# Patient Record
Sex: Female | Born: 1970 | Race: White | Hispanic: No | Smoking: Never smoker
Health system: Southern US, Community
[De-identification: ages and names within clinical notes are randomized; demographics above are authoritative.]

## PROBLEM LIST (undated history)

## (undated) DIAGNOSIS — Z8619 Personal history of other infectious and parasitic diseases: Secondary | ICD-10-CM

## (undated) DIAGNOSIS — L509 Urticaria, unspecified: Secondary | ICD-10-CM

## (undated) DIAGNOSIS — H209 Unspecified iridocyclitis: Secondary | ICD-10-CM

## (undated) HISTORY — DX: Urticaria, unspecified: L50.9

## (undated) HISTORY — DX: Unspecified iridocyclitis: H20.9

## (undated) HISTORY — DX: Personal history of other infectious and parasitic diseases: Z86.19

---

## 2002-01-04 HISTORY — PX: COMBINED AUGMENTATION MAMMAPLASTY AND ABDOMINOPLASTY: SUR291

## 2005-09-14 ENCOUNTER — Encounter: Admission: RE | Admit: 2005-09-14 | Discharge: 2005-09-14 | Payer: Self-pay | Admitting: *Deleted

## 2006-12-09 ENCOUNTER — Inpatient Hospital Stay (HOSPITAL_COMMUNITY): Admission: AD | Admit: 2006-12-09 | Discharge: 2006-12-09 | Payer: Self-pay | Admitting: Obstetrics & Gynecology

## 2007-02-20 ENCOUNTER — Inpatient Hospital Stay (HOSPITAL_COMMUNITY): Admission: AD | Admit: 2007-02-20 | Discharge: 2007-03-06 | Payer: Self-pay | Admitting: Obstetrics and Gynecology

## 2007-02-20 ENCOUNTER — Ambulatory Visit: Payer: Self-pay | Admitting: Internal Medicine

## 2007-02-20 ENCOUNTER — Ambulatory Visit: Payer: Self-pay | Admitting: Cardiology

## 2007-02-23 ENCOUNTER — Ambulatory Visit: Payer: Self-pay | Admitting: Vascular Surgery

## 2007-02-23 ENCOUNTER — Encounter (INDEPENDENT_AMBULATORY_CARE_PROVIDER_SITE_OTHER): Payer: Self-pay | Admitting: Obstetrics and Gynecology

## 2007-02-23 ENCOUNTER — Encounter: Payer: Self-pay | Admitting: Internal Medicine

## 2007-03-03 ENCOUNTER — Encounter (INDEPENDENT_AMBULATORY_CARE_PROVIDER_SITE_OTHER): Payer: Self-pay | Admitting: Obstetrics

## 2007-03-03 ENCOUNTER — Ambulatory Visit: Payer: Self-pay | Admitting: Family Medicine

## 2007-11-03 ENCOUNTER — Ambulatory Visit: Payer: Self-pay | Admitting: *Deleted

## 2007-11-16 ENCOUNTER — Ambulatory Visit: Payer: Self-pay | Admitting: *Deleted

## 2007-11-16 DIAGNOSIS — J069 Acute upper respiratory infection, unspecified: Secondary | ICD-10-CM | POA: Insufficient documentation

## 2007-11-16 DIAGNOSIS — D649 Anemia, unspecified: Secondary | ICD-10-CM | POA: Insufficient documentation

## 2008-10-22 IMAGING — CR DG CHEST 2V
2 series · 2 of 2 positions shown · non-contrast
Comparison: none

CLINICAL DATA: Pneumonia

Chest 2 view:
Comparison to previous exam. There is central pulmonary vascular congestion.
Bibasilar interstitial and patchy airspace opacities in the right middle lobe
and both lower lobes stable since previous exam. Probable tiny pleural
effusions. Heart size upper limits normal.

[view not recorded (1 of 2)]
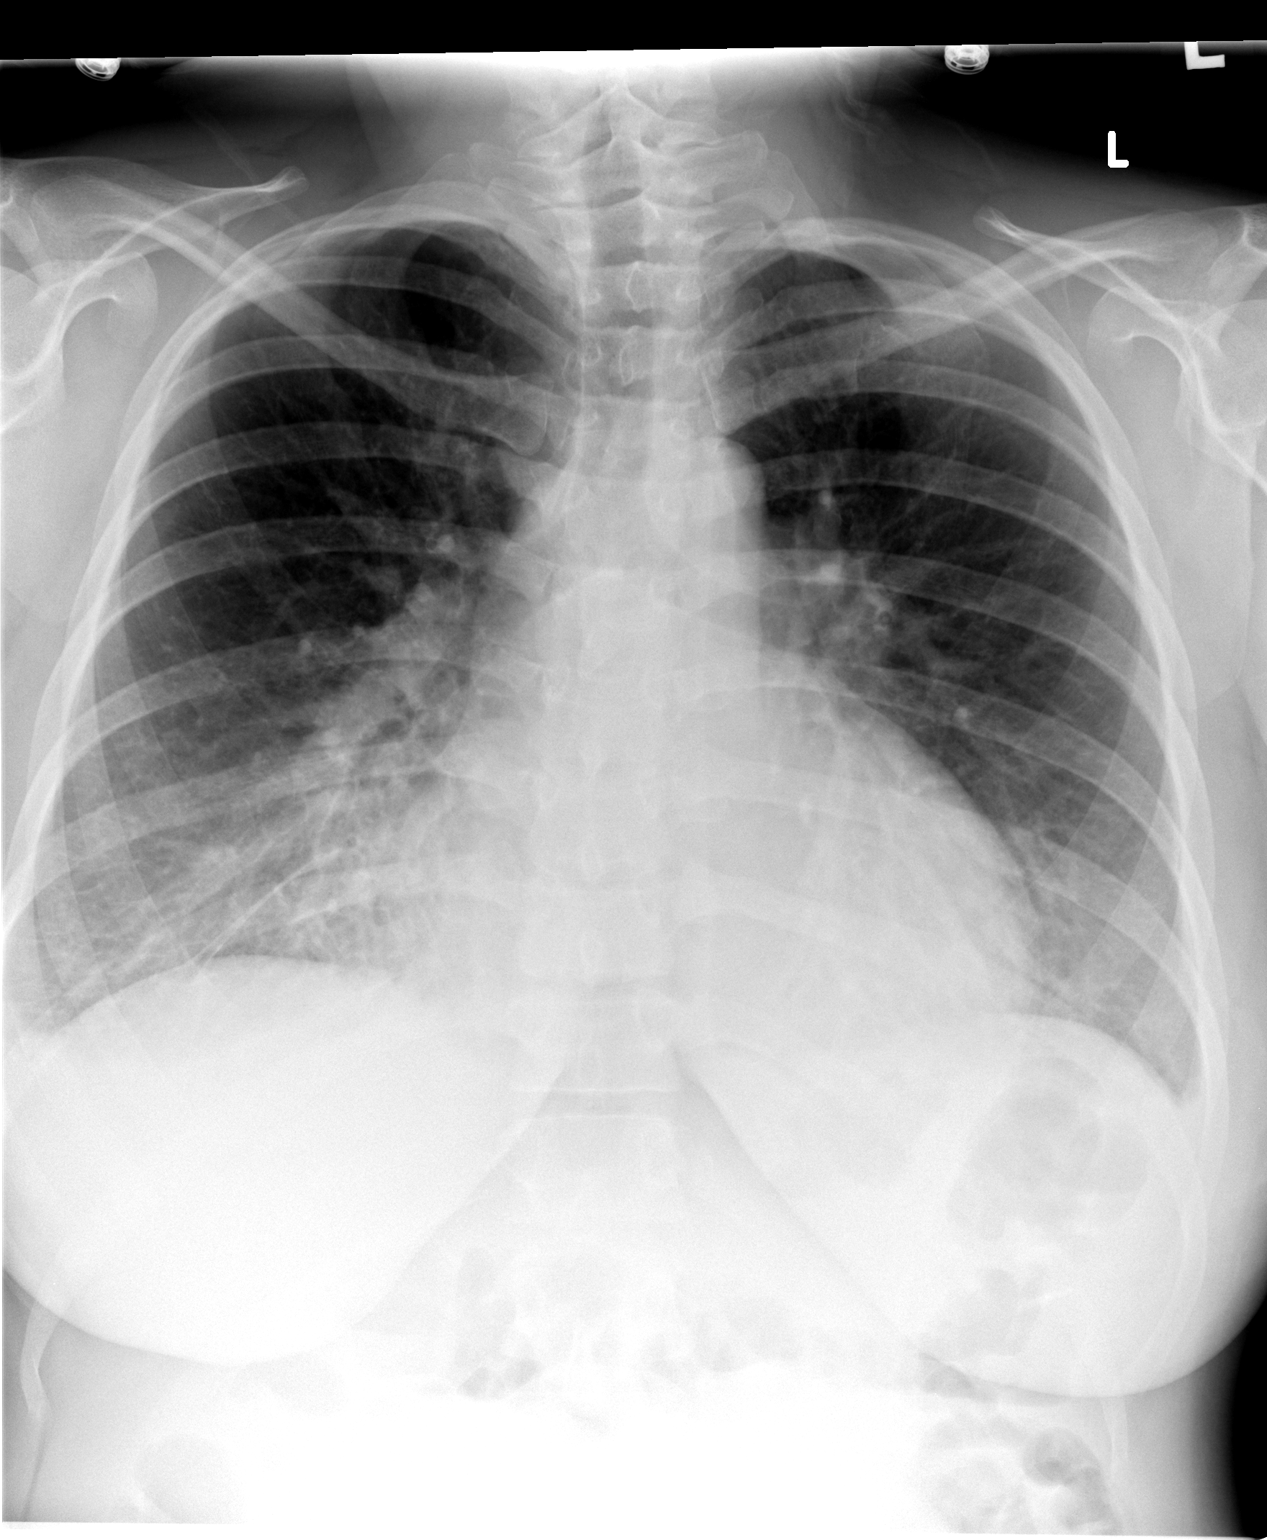

[view not recorded (2 of 2)]
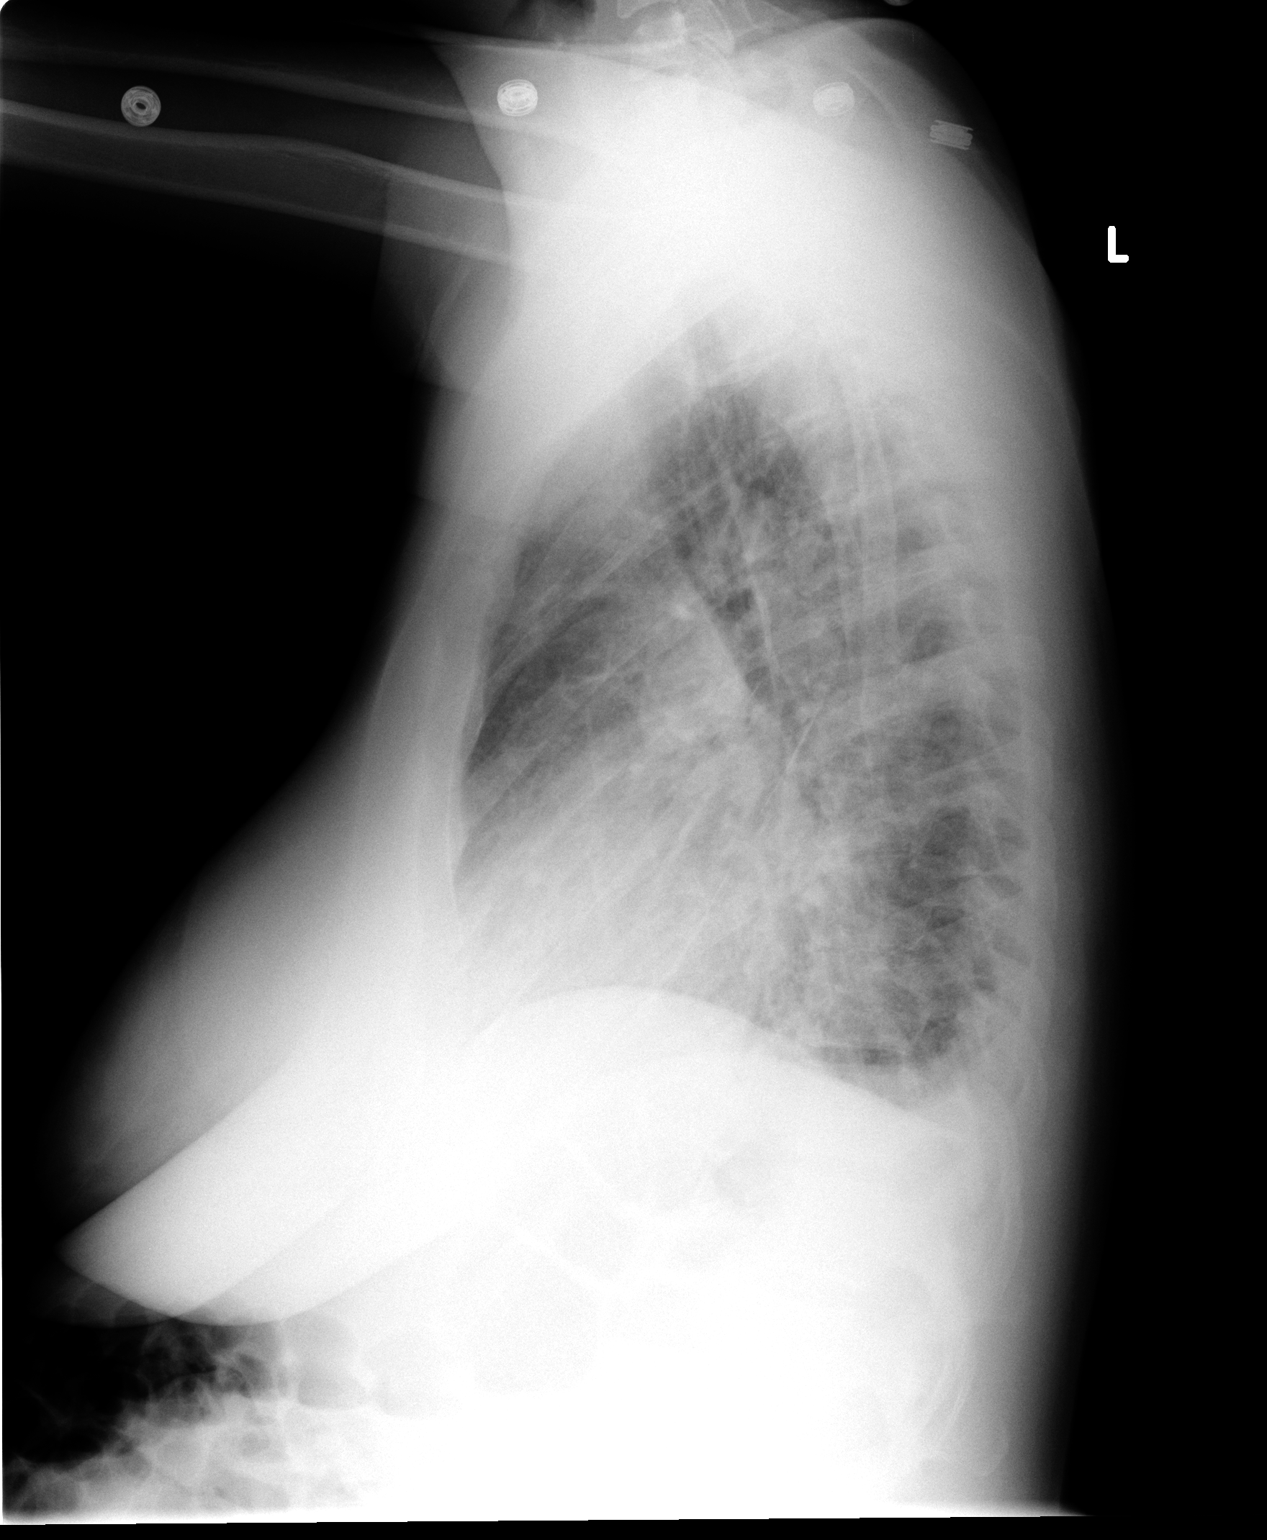

[2 of 2 positions shown; findings below may reference images not displayed]

IMPRESSION: 1. Stable bibasilar infiltrates/edema with tiny effusions.

## 2010-05-19 NOTE — Op Note (Signed)
NAMESABINE, TENENBAUM                ACCOUNT NO.:  [id number]   MEDICAL RECORD NO.:  [id number]          PATIENT TYPE:  INP   LOCATION:  9310                          FACILITY:  [facility]   PHYSICIAN:  Lendon Colonel, MD   DATE OF BIRTH:  01/10/1970   DATE OF PROCEDURE:  03/03/2007  DATE OF DISCHARGE:                               OPERATIVE REPORT   PREOPERATIVE DIAGNOSIS:  Active labor, 32 weeks, di/di twins, premature  or preterm rupture of membranes.   POSTOPERATIVE DIAGNOSIS:  Active labor, 32 weeks, di/di twins, premature  or preterm rupture of membranes.   PROCEDURE:  Primary low transverse cesarean section.   SURGEON:  Alphonsus Sias. Ernestina Penna, MD.   ASSISTANTShelbie Proctor. Shawnie Pons, MD.   ANESTHESIA:  Spinal.   SPECIMENS:  Placenta.   DISPOSITION:  Pathology.   ESTIMATED BLOOD LOSS:  800 ml.   COMPLICATIONS:  None.   FINDINGS:  Normal uterus, tubes, and ovaries, normal placenta, two  female babies.  Baby A was ruptured, Apgar's 7 and 9, cord gas 7.31,  vertex position, 3 pounds.  Baby B membranes intact, Apgar's 5 and 7,  cord pH 7.37, 3 pounds, complete breech.   INDICATIONS:  This is a 40 year old, G1, at 32 weeks 0 days by IVF  dating, who had been in-house for 10 days for premature preterm rupture  of membranes.  The patient is status post betamethasone, status post  latency antibiotics.  Two hours prior to delivery, the patient began  noticing active contractions and abdominal pain.  Her white count had  elevated to 18, and on the tocometer she was contracting every two  minutes.  Cervical exam revealed the patient to be 5 cm, and decision  was made to proceed to the operating room.  Labor ensued quite rapidly  and by the time we entered the operating room the patient was fully  dilated with the vertex of Baby A in the +3 station.   PROCEDURE:  After adequate anesthesia was obtained, the patient was  prepped and draped in the normal sterile fashion in the dorsal supine  position.  A Foley catheter was inserted into the bladder.  A  Pfannenstiel skin incision was made with the scalpel and carried through  to the underlying layer of fascia with the Bovie cautery, and the fascia  was incised in the midline, and the incision was extended laterally with  the bandage scissors.  The inferior aspect of the fascial incision was  grasped with the Kocher clamps, elevated up, and the underlying rectus  muscles dissected off sharply.  Attention turned to the superior aspect  of the fascial incision at which time the fascia was grasped with the  pickups and the underlying rectus muscles were dissected off sharply.  The rectus muscles were separated in the midline bluntly, the  pyramidalis muscles were divided with the Mayo scissors.  The peritoneum  was identified and entered bluntly, and the peritoneal incision was  extended superiorly and inferiorly with good visualization of the  bladder.  The bladder was noted to be adhered very high on  the uterus  and was taken down sharply with Metzenbaums..  The bladder blade was  inserted, the lower uterine segment was incised in a transverse fashion  with the scalpel.  Baby A was delivered in the vertex position without  complication.  The cord was clamped and cut, and the infant handed off  to the awaiting neonatologist.  The sac of Baby B was ruptured, and Baby  B was delivered in a complete breech position.  The cord was clamped and  cut, and the infant was handed off to the awaiting neonatologist.  The  cord gases were obtained, the placenta was  expressed, the uterus was  exteriorized, cleared of all clots and debris, the uterine incision was  repaired with #0 Vicryl in a running lock fashion.  On the second layer,  the same suture was used in an imbricating fashion to obtain excellent  hemostasis.  The uterus was returned to the abdomen, the gutters were  cleared of all clots and debris, the uterine incision was  reinspected  and found to be hemostatic, and the tagged edges were cut.  The  peritoneum was closed with a #3-0 Vicryl on the CT-1 in a running  fashion.  The fascia was reinspected and found to be hemostatic.  The  fascia was closed with an #0 Vicryl in a running fashion in a single  layer.  The subcutaneous tissue was irrigated, several small capillary  bleeders were Bovie cauterized for hemostasis, and the skin was closed  with staples.  The patient tolerated the procedure well, sponge, lap,  and needle counts were correct x3, and the patient was taken to the  recovery room in stable condition.      Lendon Colonel, MD  Electronically Signed     KAF/MEDQ  D:  03/03/2007  T:  03/03/2007  Job:  [id number]

## 2010-05-19 NOTE — H&P (Signed)
NAMEKRISHIKA, BUGGE                ACCOUNT NO.:  [id number]   MEDICAL RECORD NO.:  [id number]          PATIENT TYPE:  INP   LOCATION:  9160                          FACILITY:  [facility]   PHYSICIAN:  Lenoard Aden, M.D.DATE OF BIRTH:  [birth year]   DATE OF ADMISSION:  02/20/2007  DATE OF DISCHARGE:                              HISTORY & PHYSICAL   CHIEF COMPLAINT:  Spontaneous rupture of membranes.   HISTORY OF PRESENT ILLNESS:  She is a 40 year old white female, G1, P0  at 30-3/7 weeks with a known twin gestation, who presents with  spontaneous rupture of membranes.  This evening she denies contractions.  She has history of fertility related assistance to achieve pregnancy  with success as noted.  She has otherwise no documented explanation for  her infertility.   Pregnancy course has been complicated by preterm labor with, to date,  concordant growth of twins and negative fetal fibrinosis to date.   ALLERGIES:  No known drug allergies.   MEDICATIONS:  Prenatal vitamins and progesterone.   PAST SURGICAL HISTORY:  She has a history of  1. Breast augmentation.  2. Laparoscopy for endometriosis.   SOCIAL HISTORY:  She is a nonsmoker, nondrinker.  She denies domestic or  physical violence.   FAMILY HISTORY:  Remarkable for heart disease and chronic hypertension.   PHYSICAL EXAMINATION:  GENERAL APPEARANCE:  She is a well-developed,  well-nourished white female in no acute distress.  HEENT:  Normal.  LUNGS:  Clear.  CARDIOVASCULAR:  Regular rate and rhythm.  ABDOMEN:  Soft, gravid and nontender.  NEUROLOGIC:  Nonfocal.  SKIN:  Intact.  PELVIC:  Cervix 1 cm, 100%, vertex 0 stage, clear amniotic fluid noted.  NSTs are reassuring for A and B.   Ultrasound reveals a vertex presenting twin A with a subjectively  decreased amniotic fluid volume at the 59th percentile.  Twin B is  transverse with biometry pending.   IMPRESSION:  A 30-3/7 week twin intrauterine pregnancy, vertex,  transverse presenting.  PPROM of A, no evidence of active labor at this  time.   PLAN:  Given that GBS is pending, will now switch from ampicillin to  ampicillin and erythromycin followed by standard protocol.  Monitor  continuously.  Monitor signs and symptoms of choreo.  Will administer  betamethasone 12.5 mg IM, repeat in 24 hours.  If the patient labors,  she has been counseled by her primary physician, Dr. Earlene Plater, to  potentially proceed with cesarean section.      Lenoard Aden, M.D.  Electronically Signed     RJT/MEDQ  D:  02/20/2007  T:  02/20/2007  Job:  [id number]

## 2010-05-19 NOTE — Consult Note (Signed)
NAMEJACKLINE, [name]                ACCOUNT NO.:  [id number]   MEDICAL RECORD NO.:  [id number]          PATIENT TYPE:  INP   LOCATION:  9156                          [facility]ACILITY:  [facility]   PHYSICIAN:  Kalman Shan, MD   DATE OF BIRTH:  [birth year]   DATE OF CONSULTATION:  02/23/2007  DATE OF DISCHARGE:                                 CONSULTATION   TIME OF EVALUATION:  Between 1 and 1:45 p.m., February 23, 2007, 45-  minute inpatient consult.   CHIEF COMPLAINT:  Shortness of breath and chest tightness after  spontaneous rupture of membranes.   REQUESTING PHYSICIAN:  Maxie Better, M.D.   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  She  has been in bedrest since 19 weeks of gestation due to a shortened  cervix.  On Monday, February 20, 2007 in the early hours at around 2:45  a.m., she reports that she had spontaneous rupture of membranes and had  spillage of amniotic fluid.  Therefore, she presented to the [facility]   The following day, she recollects getting some IV magnesium which was  February 17, Tuesday.  That same night, she began to experience some  chest tightness.  She attributes the development of chest tightness to  the IV magnesium for the rest of the night she had chest tightness along  with orthopnea.  She could not lie at anything below 45 degrees.  The  symptoms persisted through February 18, Wednesday during which she  received 20 mg of IV Lasix x1 and since then has begun to feel  progressively better.  This morning, she reports improved chest  tightness and orthopnea.  Bedside RN states that day shift nurses have  never auscultated any crackles, but night shift nurses for the last  three nights have auscultated crackles.  Chest x-ray which was done on  February 18 and 19 shows, in my opinion, cardiomegaly, and bilateral  interstitial infiltrates (official  report is pulmonary vascular  congestion).   On admission, she was started on amoxicillin and erythromycin for  spontaneous rupture of membranes, but on the 18th of February, 2009, she  was started on Rocephin and azithromycin in case the current problems  were related to community-acquired pneumonia.  The patient is puzzled  that she could have community-acquired pneumonia because she has been at  bedrest and isolated from any sick contacts during this entire time.   She denies any abnormal edema.  There is no history of any tocolytic use  during this admission to prevent preterm labor.  Bedside RN denies any  high blood pressure or any issues such as preeclampsia.  No unilateral  swelling of the legs, no hemoptysis.   Of note, she has had some nonspecific right back pain and right scapular  pain that has been persistent since admission of February 16.  She is  not sure if this is related to her current problems.  She denies any  fever, paroxysmal nocturnal dyspnea, although she admits  to some cough  and some wheezing intermittently.   PAST MEDICAL HISTORY:  G1, P0, [redacted] weeks pregnant.  Antenatal history  complicated by a history of fertility assistance.  Pregnancy has been  complicated by preterm labor.   PAST SURGICAL HISTORY:  1. Breast augmentation.  2. Laparoscopic for endometriosis.   ALLERGIES:  NO KNOWN DRUG ALLERGIES.   HOME MEDICATIONS:  Prenatal vitamins and progesterone.   CURRENT MEDICATIONS:  Include docusate, prenatal vitamins, ferrous  sulfate, azithromycin on day 2, ceftriaxone on day 2, Tylenol p.r.n.,  antacid, erythromycin, ophthalmic ointment, pitocin if needed but is not  required, because she has not had any contractions, Ambien p.r.n.   REVIEW OF SYSTEMS:  Positive as in history of present illness, no foul-  smelling vaginal discharge, no hematuria, no abnormal contractions.  Fetal heart rate is supposedly good per bedside RN.   SOCIAL HISTORY:   Nonsmoker, nondrinker.  Denies domestic violence.  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   PHYSICAL EXAMINATION:  VITAL SIGNS:  Heart rate 88, pulse ox 96%,  respiratory rate 20, blood pressure 100/53 with a  of 68.  Review of the  vital signs for the entire list showed that they are pretty stable.  She  has been afebrile.  GENERAL:  Alert and oriented x3, speech normal, ambulation normal, very  pleasant and cooperative.  PSYCHIATRIC:  Pleasant affect, cooperative.  HEENT:  Elevated JVP 7 to 8 cm above the angle of Louis at a 45-degree  angle.  No neck nodes.  Hepatojugular reflux present.  CARDIOVASCULAR:  Normal heart sounds, normal rate, no murmurs.  RESPIRATORY:  Central air entry equal, faint crackles heard basally but  I am unsure.  Respiratory exam could equally be clear.  ABDOMEN:  Soft, obese because of the pregnancy, hepatojugular reflux  present, normal bowel sounds.  EXTREMITIES:  Mild bilateral pedal edema consistent with pregnancy.  SKIN:  Intact.  MUSCULOSKELETAL:  No joint deformities.   LABORATORY DATA:  Chest x-ray:  Official report on February 18  is  concern for right middle lobe pneumonia superimposed on venous  congestion and mild interstitial edema as read by Dr. Genevive Bi.  I personally feel there is bilateral interstitial infiltrates with  possible cardiomegaly.  In fact, this is more pronounced on the 03/12/22 chest x-ray, however, the cardiomegaly seems more prominent in  current chest x-ray.  However, this could be artifactual cardiomegaly  because it is a portable view.   LAB WORK:  Hematology admission on the 16th of February shows a white  count of 15, hemoglobin 10.7, platelets 235.  The steroids were  instituted, and the white count went up to 22.6 on February 18 after  starting azithromycin and ceftriaxone. White count is now 17,000,  platelet count is 210,000.  The white count shows 82% polymorphs.   ASSESSMENT/PLAN:  This is a 40 year old  G1, P0 with spontaneous rupture  of membranes at 31 weeks of pregnancy.  No spontaneous labor.  Differential diagnosis includes:   1. Preeclampsia.  This is unlikely because the blood pressure is      normal.  2. Amniotic fluid embolism.  This is not possible because she is      currently well.  Most of these patients develop acute illness and      die quickly.  3. Community-acquired pneumonia.  The x-ray does not fit in with      community-acquired pneumonia, and there is no fever, and there is  no sputum production.  However, it would be prudent to rule it out      by getting urine Legionella and Streptococcal antigen.  If      positive, it will be confirmatory.  If it is negative, will have to      use clinical pretest probability.  I agree with continuing      azithromycin and ceftriaxone for now.  4. Aspiration pneumonia.  She has been in the bed.  It is possible she      aspirated some sinus secretions, but there is no clear-cut      vomiting.  The x-ray does not fit in again with aspiration      pneumonia.  5. Pulmonary embolism.  Again, the history and the x-ray is      inconsistent with that.  6. Cardiac failure.  This is actually possible because the x-ray shows      possible cardiomegaly, bilateral interstitial infiltrates, and a      slight jugular venous pulse, and crackles were heard by the nurses.      For this, I would obtain a 2-D echo and beta natriuretic peptide      immediately.  The fact that she also responded to Lasix suggests      that there might be some underlying cardiac issues.  7. Tocolytic Pulmonary Edema - At first I thought this is unlikely due      to lack of beta agonist administration. However, after discussing      with Dr. [facility] this is probably the most likely diagnosis      due to the fact she got magnesium, and is twin pregnancy.   PLAN:  To get 2-D echo and labs, and I will follow.   Thank you for this interesting  consult.      Kalman Shan, MD  Electronically Signed     MR/MEDQ  D:  02/23/2007  T:  02/23/2007  Job:  [id number]

## 2010-05-22 NOTE — Discharge Summary (Signed)
NAMELATASHIA, KOCH                ACCOUNT NO.:  [id number]   MEDICAL RECORD NO.:  [id number]          PATIENT TYPE:  INP   LOCATION:  9310                          FACILITY[facility]  [facility]   PHYSICIAN:  Lendon Colonel, MD   DATE OF BIRTH:  01/31/1970   DATE OF ADMISSION:  02/20/2007  DATE OF DISCHARGE:  03/06/2007                               DISCHARGE SUMMARY   CHIEF COMPLAINT:  Rupture of membranes.   HISTORY OF PRESENT ILLNESS:  This is a 40 year old G1, P0, at 30 weeks  and 3 days' gestation with known twin gestation who presented with  spontaneous rupture of membranes.  Upon admission, she denied  contractions.  She does have a history of infertility, and the current  pregnancy is a result of infertility treatment . She has a h/o  endometriosis.  Until the time of admission, the patient has a  concordant growth of twins and negative fetal fibronectins today.   MEDICATIONS:  Include prenatal vitamins and progesterone.   ALLERGIES:  No known drug allergies.   PAST SURGICAL HISTORY:  Significant for laparoscopy for endometriosis  and breast augmentation.   SOCIAL HISTORY:  She is nondrinker and nonsmoker.  Denies domestic  physical violence.   FAMILY HISTORY:  Remarkable for heart disease and chronic hypertension.   PHYSICAL EXAMINATION:  GENERAL:  On admission, physical exam revealed  her to be in no distress.  HEART:  Regular.  LUNGS: Clear.  ABDOMEN:  Soft, gravid, and nontender.  PELVIC EXAM:  Noted the cervix to be 1 cm dilated, 100% effaced.  The  vertex at stage 0, clear amniotic fluid was noted and NSTs  were  reassuring for both A and B.  An ultrasound revealed twin A in the  vertex position with decreased amniotic fluid volume and B in the  transverse position.   HOSPITAL COURSE:  Given that GBS was pending, the plan was to start  ampicillin and erythromycin for preterm premature rupture of membranes  protocol.  She monitored for signs and symptoms of  chorioamnionitis.  The patient was on betamethasone 12 mg IM q.24 hours x2 doses and group  B strep was sent.  Mode of delivery was discussed with the patient who  elected for a primary cesarean section for twin gestation.  No tocolysis  was started initially on admission.  On hospital day #2, baby A and B  both had deep variable decelerations for 2-3 minutes to the 60-70s, both  recovered with FHS in the 140s with good variability present.  These  decelerations were noted at the time of a uterine contraction, and the  patient was started on magnesium sulfate to allow completion of latency  betamethasone.  The patient was kept on continuous monitoring.  On  hospital day #3, the physician was called to see the patient for  contractions and again deceleration.  The patient had been sleeping  comfortably, did not feel any contractions, had no abdominal pain, no  vaginal bleeding and had good fetal movements.  The patient did note  chest tightness and did have a history of GERD  and had not been on  antacids since admission.  On hospital day #3, her O2 sats were 90%-99%  and her pulse rates were 96-110.  The patient's lungs were clear with no  crackles.  Fetal testing was noted to be overall reactive despite  several prolonged decelerations.  Decision was made for continued close  monitoring.  Magnesium sulfate was stopped given that the patient was  steroid complete.  Several hours later, the patient was noted to have  continued low O2 saturation, was now noting chest tightness, and a chest  x-ray revealed the presence of a right lower lobe pneumonia.  The  patient was started on ceftriaxone and azithromycin, and these would  also suffice for her latency antibiotics.  A pulmonary consult was  obtained, and after further review, it was thought that the patient had  pulmonary edema rather than a pneumonia.  Hydrochlorothiazide was  started and the patient diuresed appropriately.  The patient did  have  bilateral lower extremity Dopplers which revealed no evidence of DVT.  The patient slowly improved over the next week and remained without  signs or symptoms of chorio and reactive fetal testing.  On March 03, 2007, the patient noted painful contractions every 2 minutes for the  past 1-1/2 hours.  A sterile speculum exam was performed which noted the  patient to be 5 cm dilated, 100% effaced with the fetal vertex in the  negative 2 station.  The decision was to proceed to the OR for primary  cesarean section at 32 weeks' PPROM twins in active labor.  That  operative note will be found in the separate dictation.  Postoperatively, the patient did well, and by postop day #3, the patient  was afebrile without signs or symptoms of endometriosis.  She was  tolerating p.o., was voiding, ambulating, and had her pain well  controlled on p.o. meds.  Her hemoglobin was 9.0.  She was without signs  or symptoms of anemia and the decision was made to discharge the patient  to home.   DISCHARGE DIAGNOSES:  1. Status post cesarean section at 32 weeks.  2. Di-di twins.  3. Preterm premature rupture of membranes.  4. Pulmonary edema.   DISCHARGE CONDITION:  Stable.   DISCHARGE DISPOSITION:  To home.   DISCHARGE MEDICATIONS:  1. Percocet.  2. Motrin.  3. Colace.      Lendon Colonel, MD  Electronically Signed     KAF/MEDQ  D:  04/10/2007  T:  04/10/2007  Job:  [id number]

## 2010-09-25 LAB — DIFFERENTIAL
Basophils Absolute: 0.1
Basophils Absolute: 0.1
Basophils Relative: 0
Basophils Relative: 0
Basophils Relative: 0
Eosinophils Absolute: 0.1
Eosinophils Absolute: 0.1
Eosinophils Absolute: 0.1
Eosinophils Relative: 1
Eosinophils Relative: 1
Eosinophils Relative: 1
Eosinophils Relative: 1
Lymphocytes Relative: 11 — ABNORMAL LOW
Lymphocytes Relative: 9 — ABNORMAL LOW
Lymphs Abs: 1.3
Lymphs Abs: 1.5
Lymphs Abs: 1.8
Monocytes Absolute: 0.8
Monocytes Absolute: 1.3 — ABNORMAL HIGH
Monocytes Absolute: 1.3 — ABNORMAL HIGH
Monocytes Absolute: 1.4 — ABNORMAL HIGH
Monocytes Relative: 7
Monocytes Relative: 7
Monocytes Relative: 8
Monocytes Relative: 8
Neutro Abs: 14 — ABNORMAL HIGH
Neutro Abs: 9.3 — ABNORMAL HIGH
Neutrophils Relative %: 82 — ABNORMAL HIGH

## 2010-09-25 LAB — CBC
HCT: 24.7 — ABNORMAL LOW
HCT: 24.7 — ABNORMAL LOW
HCT: 26 — ABNORMAL LOW
HCT: 26.8 — ABNORMAL LOW
HCT: 26.8 — ABNORMAL LOW
HCT: 27.6 — ABNORMAL LOW
HCT: 29.4 — ABNORMAL LOW
HCT: 30 — ABNORMAL LOW
HCT: 32.5 — ABNORMAL LOW
Hemoglobin: 10.4 — ABNORMAL LOW
Hemoglobin: 10.7 — ABNORMAL LOW
Hemoglobin: 8.8 — ABNORMAL LOW
Hemoglobin: 9.5 — ABNORMAL LOW
Hemoglobin: 9.8 — ABNORMAL LOW
Hemoglobin: 9.8 — ABNORMAL LOW
MCHC: 34.7
MCHC: 35.3
MCHC: 35.3
MCHC: 35.3
MCHC: 35.5
MCHC: 35.5
MCHC: 35.7
MCV: 94.5
MCV: 94.7
MCV: 95.1
MCV: 95.3
MCV: 95.4
MCV: 95.4
MCV: 95.5
MCV: 95.6
Platelets: 235
Platelets: 239
Platelets: 241
Platelets: 271
Platelets: 319
RBC: 2.59 — ABNORMAL LOW
RBC: 2.59 — ABNORMAL LOW
RBC: 2.72 — ABNORMAL LOW
RBC: 2.81 — ABNORMAL LOW
RBC: 2.82 — ABNORMAL LOW
RBC: 2.91 — ABNORMAL LOW
RBC: 2.93 — ABNORMAL LOW
RBC: 3.09 — ABNORMAL LOW
RBC: 3.17 — ABNORMAL LOW
RDW: 13.4
RDW: 13.4
RDW: 13.4
RDW: 13.6
RDW: 13.7
WBC: 14.4 — ABNORMAL HIGH
WBC: 15 — ABNORMAL HIGH
WBC: 16.3 — ABNORMAL HIGH
WBC: 17 — ABNORMAL HIGH
WBC: 21.6 — ABNORMAL HIGH
WBC: 22.6 — ABNORMAL HIGH

## 2010-09-25 LAB — BASIC METABOLIC PANEL
CO2: 23
CO2: 25
Calcium: 8.6
Chloride: 103
Chloride: 103
Chloride: 104
Creatinine, Ser: 0.44
GFR calc Af Amer: >60
GFR calc Af Amer: >60
GFR calc Af Amer: >60
Potassium: 3.6
Sodium: 136
Sodium: 136
Sodium: 137

## 2010-09-25 LAB — B-NATRIURETIC PEPTIDE (CONVERTED LAB)
Pro B Natriuretic peptide (BNP): 487 — ABNORMAL HIGH
Pro B Natriuretic peptide (BNP): <30

## 2010-09-25 LAB — PHOSPHORUS: Phosphorus: 5 — ABNORMAL HIGH

## 2010-09-25 LAB — ABO/RH: ABO/RH(D): O POS

## 2010-09-25 LAB — TYPE AND SCREEN
ABO/RH(D): O POS
ABO/RH(D): O POS
Antibody Screen: NEGATIVE

## 2010-09-25 LAB — CK: Total CK: 28

## 2010-09-25 LAB — RPR: RPR Ser Ql: NONREACTIVE

## 2010-09-25 LAB — APTT: aPTT: 27

## 2010-09-25 LAB — CALCIUM, IONIZED: Calcium, Ion: 1.22

## 2010-10-08 ENCOUNTER — Ambulatory Visit: Payer: Self-pay | Admitting: Internal Medicine

## 2010-10-08 ENCOUNTER — Ambulatory Visit (INDEPENDENT_AMBULATORY_CARE_PROVIDER_SITE_OTHER): Payer: BC Managed Care – PPO | Admitting: Internal Medicine

## 2010-10-08 ENCOUNTER — Encounter: Payer: Self-pay | Admitting: Internal Medicine

## 2010-10-08 DIAGNOSIS — Z1239 Encounter for other screening for malignant neoplasm of breast: Secondary | ICD-10-CM

## 2010-10-08 DIAGNOSIS — L989 Disorder of the skin and subcutaneous tissue, unspecified: Secondary | ICD-10-CM

## 2010-10-08 DIAGNOSIS — E7800 Pure hypercholesterolemia, unspecified: Secondary | ICD-10-CM

## 2010-10-08 DIAGNOSIS — R7309 Other abnormal glucose: Secondary | ICD-10-CM

## 2010-10-08 DIAGNOSIS — D649 Anemia, unspecified: Secondary | ICD-10-CM

## 2010-10-08 DIAGNOSIS — R739 Hyperglycemia, unspecified: Secondary | ICD-10-CM

## 2010-10-08 NOTE — Patient Instructions (Signed)
Please schedule cbc (anemia), chem7 (abn chemistry) and lipid (272.0) for next Friday morning fasting. Please call to schedule tetanus booster (tdap) when your schedule permits

## 2010-10-08 NOTE — Progress Notes (Signed)
  Subjective:    Patient ID: Jo [name], female    DOB: 06-Jan-1970, 40 y.o.   MRN: [id number]  HPI Pt presents to clinic for evaluation of multiple medical problems. H/o anemia with hgb ~9 in 2009. No gross active bleeding. Sees gyn for pap smears. Notes h/o mild hyperlipidemia that has not required medication in the past. Was told previously that blood sugar value was high but was likely not fasting. No h/o dm. Notes right LE extremity hypopigmented skin lesion that is itchy and irritated. Interested in dermatology skin check. No other complaints.   Reviewed pmh, psh, medications, allergies, soc hx and fam hx.    Review of Systems  Gastrointestinal: Negative for abdominal pain and blood in stool.  Skin: Negative for color change and rash.  All other systems reviewed and are negative.       Objective:   Physical Exam  Nursing note and vitals reviewed. Constitutional: She appears well-developed and well-nourished. No distress.  HENT:  Head: Normocephalic and atraumatic.  Right Ear: External ear normal.  Left Ear: External ear normal.  Nose: Nose normal.  Mouth/Throat: Oropharynx is [name] and moist. No oropharyngeal exudate.  Eyes: Conjunctivae and EOM are normal. Pupils are equal, round, and reactive to light. Right eye exhibits no discharge. Left eye exhibits no discharge. No scleral icterus.  Neck: Neck supple.  Cardiovascular: Normal rate, regular rhythm and normal heart sounds.  Exam reveals no gallop and no friction rub.   No murmur heard. Pulmonary/Chest: Effort normal and breath sounds normal. No respiratory distress. She has no wheezes. She has no rales.  Lymphadenopathy:    She has no cervical adenopathy.  Neurological: She is alert.  Skin: Skin is warm and dry. No rash noted. She is not diaphoretic. No erythema.       Right lower leg: small hypopigmented macule.   Psychiatric: She has a normal mood and affect.          Assessment & Plan:

## 2010-10-11 DIAGNOSIS — D649 Anemia, unspecified: Secondary | ICD-10-CM | POA: Insufficient documentation

## 2010-10-11 DIAGNOSIS — L989 Disorder of the skin and subcutaneous tissue, unspecified: Secondary | ICD-10-CM | POA: Insufficient documentation

## 2010-10-11 DIAGNOSIS — R739 Hyperglycemia, unspecified: Secondary | ICD-10-CM | POA: Insufficient documentation

## 2010-10-11 DIAGNOSIS — E7800 Pure hypercholesterolemia, unspecified: Secondary | ICD-10-CM | POA: Insufficient documentation

## 2010-10-11 NOTE — Assessment & Plan Note (Signed)
Obtain fasting lipid profile

## 2010-10-11 NOTE — Assessment & Plan Note (Signed)
Obtain cbc  

## 2010-10-11 NOTE — Assessment & Plan Note (Signed)
Benign appearing. Requests derm skin check

## 2010-10-11 NOTE — Assessment & Plan Note (Signed)
?  previous value non fasting. Obtain chem7

## 2010-10-12 LAB — URINALYSIS, ROUTINE W REFLEX MICROSCOPIC
Bilirubin Urine: NEGATIVE
Glucose, UA: NEGATIVE
Ketones, ur: NEGATIVE
Protein, ur: NEGATIVE

## 2010-10-13 ENCOUNTER — Other Ambulatory Visit: Payer: Self-pay | Admitting: Internal Medicine

## 2010-10-13 DIAGNOSIS — D649 Anemia, unspecified: Secondary | ICD-10-CM

## 2010-10-13 DIAGNOSIS — R799 Abnormal finding of blood chemistry, unspecified: Secondary | ICD-10-CM

## 2010-10-13 DIAGNOSIS — E7800 Pure hypercholesterolemia, unspecified: Secondary | ICD-10-CM

## 2010-11-06 ENCOUNTER — Ambulatory Visit (HOSPITAL_BASED_OUTPATIENT_CLINIC_OR_DEPARTMENT_OTHER)
Admission: RE | Admit: 2010-11-06 | Discharge: 2010-11-06 | Disposition: A | Discharge status: Home / Self Care | Payer: BC Managed Care – PPO | Source: Ambulatory Visit | Attending: Internal Medicine | Admitting: Internal Medicine

## 2010-11-06 ENCOUNTER — Other Ambulatory Visit: Payer: Self-pay | Admitting: Internal Medicine

## 2010-11-06 DIAGNOSIS — Z1231 Encounter for screening mammogram for malignant neoplasm of breast: Secondary | ICD-10-CM

## 2010-11-06 DIAGNOSIS — Z1239 Encounter for other screening for malignant neoplasm of breast: Secondary | ICD-10-CM

## 2012-01-14 ENCOUNTER — Telehealth: Payer: Self-pay | Admitting: *Deleted

## 2012-01-14 ENCOUNTER — Ambulatory Visit (INDEPENDENT_AMBULATORY_CARE_PROVIDER_SITE_OTHER): Payer: BC Managed Care – PPO | Admitting: Family

## 2012-01-14 VITALS — BP 88/60 | HR 76 | Temp 98.0°F | Wt 142.0 lb

## 2012-01-14 DIAGNOSIS — H109 Unspecified conjunctivitis: Secondary | ICD-10-CM

## 2012-01-14 MED ORDER — CIPROFLOXACIN HCL 0.3 % OP SOLN: 1.0000 [drp] | OPHTHALMIC | Status: DC

## 2012-01-14 NOTE — Telephone Encounter (Signed)
Fax failed and was called to [facility] at [facility].

## 2012-01-14 NOTE — Telephone Encounter (Signed)
Rx printed and was faxed to [facility].

## 2012-01-14 NOTE — Assessment & Plan Note (Signed)
Will rx with ciloxan.

## 2012-01-14 NOTE — Progress Notes (Signed)
  Subjective:    Patient ID: Jo [name], female    DOB: 1970-02-18, 42 y.o.   MRN: [id number]  HPI   Jo [name] is a 42 yr old female who presents today with chief complaint of eye drainage/irritation from the left eye.  She reports that she woke up with these symptoms this AM.  Her Co-worker recently diagnosed with pink eye. Review of Systems    se HPI  Past Medical History  Diagnosis Date  . History of chicken pox     childhood    History   Social History  . Marital Status: Married    Spouse Name: N/A    Number of Children: N/A  . Years of Education: N/A   Occupational History  . Not on file.   Social History Main Topics  . Smoking status: Never Smoker   . Smokeless tobacco: Never Used  . Alcohol Use: Yes  . Drug Use: No  . Sexually Active: Not on file   Other Topics Concern  . 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 Cesarean section 2009  . Combined augmentation mammaplasty and abdominoplasty 2004    Family History  Problem Relation Age of Onset  . Hyperlipidemia Mother   . Hyperlipidemia Father   . Hypertension Mother   . Hypertension Father   . Diabetes Paternal Grandmother     No Known Allergies  Current Outpatient Prescriptions on File Prior to Visit  Medication Sig Dispense Refill  . Norethindrone Acetate-Ethinyl Estrad-FE (LOESTRIN 24 FE) 1-20 MG-MCG(24) tablet Take 1 tablet by mouth daily.          BP 88/60  Pulse 76  Temp 98 F (36.7 C)  Wt 142 lb (64.411 kg)  SpO2 100%    Objective:   Physical Exam  Constitutional: She appears well-developed and well-nourished. No distress.  HENT:  Head: Normocephalic and atraumatic.  Right Ear: Tympanic membrane and ear canal normal.  Left Ear: Tympanic membrane and ear canal normal.  Mouth/Throat: No oropharyngeal exudate or posterior oropharyngeal edema.  Eyes:       R  eye normal.  L eye, mildly injected, no visible crusting PERRLA  Psychiatric: She has a normal mood and affect. Her behavior is normal. Judgment and thought content normal.          Assessment & Plan:

## 2012-01-14 NOTE — Patient Instructions (Addendum)
Conjunctivitis Conjunctivitis is commonly called "pink eye." Conjunctivitis can be caused by bacterial or viral infection, allergies, or injuries. There is usually redness of the lining of the eye, itching, discomfort, and sometimes discharge. There may be deposits of matter along the eyelids. A viral infection usually causes a watery discharge, while a bacterial infection causes a yellowish, thick discharge. Pink eye is very contagious and spreads by direct contact. You may be given antibiotic eyedrops as part of your treatment. Before using your eye medicine, remove all drainage from the eye by washing gently with warm water and cotton balls. Continue to use the medication until you have awakened 2 mornings in a row without discharge from the eye. Do not rub your eye. This increases the irritation and helps spread infection. Use separate towels from other household members. Wash your hands with soap and water before and after touching your eyes. Use cold compresses to reduce pain and sunglasses to relieve irritation from light. Do not wear contact lenses or wear eye makeup until the infection is gone. SEEK MEDICAL CARE IF:   Your symptoms are not better after 3 days of treatment.  You have increased pain or trouble seeing.  The outer eyelids become very red or swollen. Document Released: 01/29/2004 Document Revised: 03/15/2011 Document Reviewed: 12/21/2004 [employer] Patient Information 2013 [employer].  

## 2012-02-21 ENCOUNTER — Encounter: Payer: Self-pay | Admitting: Family Medicine

## 2012-02-21 ENCOUNTER — Ambulatory Visit (INDEPENDENT_AMBULATORY_CARE_PROVIDER_SITE_OTHER): Payer: BC Managed Care – PPO | Admitting: Family Medicine

## 2012-02-21 VITALS — BP 100/68 | HR 70 | Temp 98.0°F | Wt 139.0 lb

## 2012-02-21 DIAGNOSIS — J019 Acute sinusitis, unspecified: Secondary | ICD-10-CM

## 2012-02-21 MED ORDER — CEFUROXIME AXETIL 500 MG PO TABS: 500.0000 mg | ORAL_TABLET | Freq: Two times a day (BID) | ORAL | Status: AC

## 2012-02-21 NOTE — Progress Notes (Signed)
  Subjective:     Jo [name] is a 42 y.o. female who presents for evaluation of sinus pain. Symptoms include: congestion, facial pain, headaches, nasal congestion and sinus pressure. Onset of symptoms was 1 month ago. Symptoms have been gradually worsening since that time. Past history is significant for no history of pneumonia or bronchitis. Patient is a non-smoker.  The following portions of the patient's history were reviewed and updated as appropriate:  She  has a past medical history of History of chicken pox. She  does not have any pertinent problems on file. She  has past surgical history that includes Cesarean section (2009) and Combined augmentation mammaplasty and abdominoplasty (2004). Her family history includes Diabetes in her paternal grandmother; Hyperlipidemia in her father and mother; and Hypertension in her father and mother. She  reports that she has never smoked. She has never used smokeless tobacco. She reports that  drinks alcohol. She reports that she does not use illicit drugs. She has a current medication list which includes the following prescription(s): norethindrone acetate-ethinyl estrad-fe and cefuroxime. Current Outpatient Prescriptions on File Prior to Visit  Medication Sig Dispense Refill  . Norethindrone Acetate-Ethinyl Estrad-FE (LOESTRIN 24 FE) 1-20 MG-MCG(24) tablet Take 1 tablet by mouth daily.         No current facility-administered medications on file prior to visit.   She has No Known Allergies..  Review of Systems Pertinent items are noted in HPI.   Objective:    BP 100/68  Pulse 70  Temp(Src) 98 F (36.7 C) (Oral)  Wt 139 lb (63.05 kg)  BMI 23.5 kg/m2  SpO2 99% General appearance: alert, cooperative, appears stated age and no distress Ears: + fluid, dul tm Nose: green discharge, moderate congestion, turbinates red, swollen, right turbinate red, swollen, sinus tenderness bilateral Throat: lips, mucosa, and tongue normal; teeth and gums  normal Neck: mild anterior cervical adenopathy and thyroid not enlarged, symmetric, no tenderness/mass/nodules Lungs: clear to auscultation bilaterally    Assessment:    Acute bacterial sinusitis.    Plan:    Nasal steroids per medication orders. Antihistamines per medication orders. Ceftin per medication orders.

## 2012-02-21 NOTE — Patient Instructions (Signed)

## 2012-04-05 ENCOUNTER — Ambulatory Visit (INDEPENDENT_AMBULATORY_CARE_PROVIDER_SITE_OTHER): Payer: BC Managed Care – PPO | Admitting: Family Medicine

## 2012-04-05 ENCOUNTER — Encounter: Payer: Self-pay | Admitting: Family Medicine

## 2012-04-05 VITALS — BP 90/60 | HR 78 | Temp 98.2°F | Ht 64.5 in | Wt 138.0 lb

## 2012-04-05 DIAGNOSIS — S0002XA Blister (nonthermal) of scalp, initial encounter: Secondary | ICD-10-CM

## 2012-04-05 DIAGNOSIS — S00521A Blister (nonthermal) of lip, initial encounter: Secondary | ICD-10-CM

## 2012-04-05 DIAGNOSIS — S1092XA Blister (nonthermal) of unspecified part of neck, initial encounter: Secondary | ICD-10-CM

## 2012-04-05 NOTE — Assessment & Plan Note (Signed)
New.  Suspect grape allergy as pt reports sxs have occurred after drinking both white and red wine, after eating grapes.  Will get food panel to check.  If grapes not included in panel, will refer to allergy.  Encouraged avoidance, benadryl prn.  Reviewed supportive care and red flags that should prompt return.  Pt expressed understanding and is in agreement w/ plan.

## 2012-04-05 NOTE — Patient Instructions (Addendum)
We'll notify you of your lab results For now, avoid grapes or wine Benadryl as needed Carmex or Blistex to help heal the lips Call with any questions or concerns Hang in there! Have a great trip!!!

## 2012-04-05 NOTE — Progress Notes (Signed)
  Subjective:    Patient ID: Jo [name], female    DOB: 02/03/1970, 42 y.o.   MRN: [id number]  HPI Blister on lip- pt reports lips are blistering, somewhat painful.  Has occurred 3-4 times in 2.5 weeks.  Pt ate 6 inch veggie sub from [employer] and 'within 10 minutes felt lips were tingling'.  Last night developed small vesicles that were seeping.  This AM vesicles had crusted and now in process of drying.  No hx of food allergies.  Tingling precedes blister formation.  No hx of food allergies.  Pt reports eating 'immense amounts of tomatoes'.  Pt reports wine causes similar sxs- had red wine vinegar on sub.  sxs have occurred after drinking wine and eating grapes.  No trouble swallowing or breathing.   Review of Systems For ROS see HPI     Objective:   Physical Exam  Vitals reviewed. Constitutional: She appears well-developed and well-nourished. No distress.  HENT:  Head: Normocephalic and atraumatic.  Mouth/Throat: Oropharynx is [name] and moist.  Lips dry and peeling centrally w/out exudate or vesicles present  Skin: She is not diaphoretic.          Assessment & Plan:

## 2012-04-14 LAB — IGG FOOD PANEL
Chicken, IgG: <0.15 ug/mL (ref <0.15)
Corn, IgG: <0.15 ug/mL (ref <0.15)
Egg white, IgG: <2
Peanut, IgG: <0.15 ug/mL (ref <0.15)

## 2012-04-24 ENCOUNTER — Telehealth: Payer: Self-pay | Admitting: Family Medicine

## 2012-04-24 DIAGNOSIS — Z889 Allergy status to unspecified drugs, medicaments and biological substances status: Secondary | ICD-10-CM

## 2012-04-24 NOTE — Telephone Encounter (Signed)
Patient called again regarding referral to allergist. She would like to know if this can be expedited.

## 2012-04-24 NOTE — Telephone Encounter (Signed)
Patient states her lips are swollen again and she has not eaten any grapes. She would like referral to allergist. CB# [phone]

## 2012-04-24 NOTE — Telephone Encounter (Signed)
Ok to refer to allergist for mult allergies

## 2014-03-27 ENCOUNTER — Ambulatory Visit (INDEPENDENT_AMBULATORY_CARE_PROVIDER_SITE_OTHER): Payer: No Typology Code available for payment source | Admitting: Adult Health

## 2014-03-27 ENCOUNTER — Encounter: Payer: Self-pay | Admitting: Adult Health

## 2014-03-27 VITALS — BP 88/68 | HR 90 | Temp 98.2°F | Resp 14 | Ht 64.5 in | Wt 143.2 lb

## 2014-03-27 DIAGNOSIS — J020 Streptococcal pharyngitis: Secondary | ICD-10-CM

## 2014-03-27 LAB — POCT RAPID STREP A (OFFICE): Rapid Strep A Screen: POSITIVE — AB

## 2014-03-27 MED ORDER — AMOXICILLIN 500 MG PO CAPS: 500.0000 mg | ORAL_CAPSULE | Freq: Three times a day (TID) | ORAL | Status: DC

## 2014-03-27 MED ORDER — ONDANSETRON HCL 4 MG PO TABS: 4.0000 mg | ORAL_TABLET | Freq: Three times a day (TID) | ORAL | Status: DC | PRN

## 2014-03-27 NOTE — Progress Notes (Signed)
Pre visit review using our clinic review tool, if applicable. No additional management support is needed unless otherwise documented below in the visit note. 

## 2014-03-27 NOTE — Progress Notes (Signed)
Subjective:    Patient ID: Jo [name], female    DOB: Jun 19, 1970, 44 y.o.   MRN: [id number]  HPI  Ms. Mierzejewski presents to the office today for sore throat/fever/nausea since yesterday. She endorses pain with swallowing. Denies vomiting or diarrhea or swollen lymph nodes. Has taken 4 advil for pain relief today and feels "a little better". No sick contacts at home.    Review of Systems  Constitutional: Positive for fever and fatigue. Negative for activity change and appetite change.  HENT: Positive for sore throat. Negative for congestion, ear pain, facial swelling, postnasal drip, rhinorrhea and sinus pressure.   Respiratory: Negative for cough and shortness of breath.    Past Medical History  Diagnosis Date  . History of chicken pox     childhood    History   Social History  . Marital Status: Married    Spouse Name: N/A  . Number of Children: N/A  . Years of Education: N/A   Occupational History  . Not on file.   Social History Main Topics  . Smoking status: Never Smoker   . Smokeless tobacco: Never Used  . Alcohol Use: Yes  . Drug Use: No  . Sexual Activity: Not on file   Other Topics Concern  . 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 Cesarean section  2009  . Combined augmentation mammaplasty and abdominoplasty  2004    Family History  Problem Relation Age of Onset  . Hyperlipidemia Mother   . Hyperlipidemia Father   . Hypertension Mother   . Hypertension Father   . Diabetes Paternal Grandmother     No Known Allergies   BP 88/68 mmHg  Pulse 90  Temp(Src) 98.2 F (36.8 C) (Oral)  Resp 14  Ht 5' 4.5" (1.638 m)  Wt 143 lb 3.2 oz (64.955 kg)  BMI 24.21 kg/m2  SpO2 98%  LMP 01/26/2014       Objective:   Physical Exam  Constitutional: She is oriented to person, place, and time. She appears  well-developed and well-nourished. No distress.  HENT:  Right Ear: External ear normal.  Left Ear: External ear normal.  Mouth/Throat: Oropharyngeal exudate present.  Tonsils swollen and red. + exudate  Eyes: Right eye exhibits no discharge. Left eye exhibits no discharge.  Neck: No tracheal deviation present. No thyromegaly present.  Cardiovascular: Normal rate and normal heart sounds.  Exam reveals no friction rub.   No murmur heard. Pulmonary/Chest: No respiratory distress. She has no wheezes. She has no rales.  Lymphadenopathy:    She has cervical adenopathy.  Neurological: She is alert and oriented to person, place, and time.  Skin: Skin is warm and dry.  Psychiatric: She has a normal mood and affect. Her behavior is normal. Judgment and thought content normal.         Assessment & Plan:  Strep Throat - Rapid strep positive - Prescribed Amoxicillin 500mg  TID for 10 days.  - OTC Chloraseptic for pain relief - Gargle with warm salt water - Follow up if no improvement in 3 days or sooner if symptoms worsen.  -Ibuprofen or Advil as needed for inflammation and pain  Nausea - Prescribed ODT Zofran 4 mg to be taken every 8 hours PRN  - Follow up in three days if symptoms have not improved or sooner if symptoms worsen.

## 2014-03-27 NOTE — Patient Instructions (Addendum)
The rapid strep test in the office was positive for Strep. I have sent a prescription to your pharmacy for Amoxicillin. Please take this three times a day for 10 days. I also sent a prescription for Zofran. This goes under your tongue and will help with the nausea. You can use OTC chloraseptic to numb the back of your throat. Also gargle with warm salt water throughtout the day. Get plenty of rest and drink plenty of fluids. You should start to feel better in 3-4 days. Please let me know if your symptoms do not improve or they get worse.   Strep Throat Strep throat is an infection of the throat caused by a bacteria named Streptococcus pyogenes. Your health care provider may call the infection streptococcal "tonsillitis" or "pharyngitis" depending on whether there are signs of inflammation in the tonsils or back of the throat. Strep throat is most common in children aged 5-15 years during the cold months of the year, but it can occur in people of any age during any season. This infection is spread from person to person (contagious) through coughing, sneezing, or other close contact. SIGNS AND SYMPTOMS   Fever or chills.  Painful, swollen, red tonsils or throat.  Pain or difficulty when swallowing.  White or yellow spots on the tonsils or throat.  Swollen, tender lymph nodes or "glands" of the neck or under the jaw.  Red rash all over the body (rare). DIAGNOSIS  Many different infections can cause the same symptoms. A test must be done to confirm the diagnosis so the right treatment can be given. A "rapid strep test" can help your health care provider make the diagnosis in a few minutes. If this test is not available, a light swab of the infected area can be used for a throat culture test. If a throat culture test is done, results are usually available in a day or two. TREATMENT  Strep throat is treated with antibiotic medicine. HOME CARE INSTRUCTIONS   Gargle with 1 tsp of salt in 1 cup of warm  water, 3-4 times per day or as needed for comfort.  Family members who also have a sore throat or fever should be tested for strep throat and treated with antibiotics if they have the strep infection.  Make sure everyone in your household washes their hands well.  Do not share food, drinking cups, or personal items that could cause the infection to spread to others.  You may need to eat a soft food diet until your sore throat gets better.  Drink enough water and fluids to keep your urine clear or pale yellow. This will help prevent dehydration.  Get plenty of rest.  Stay home from school, day care, or work until you have been on antibiotics for 24 hours.  Take medicines only as directed by your health care provider.  Take your antibiotic medicine as directed by your health care provider. Finish it even if you start to feel better. SEEK MEDICAL CARE IF:   The glands in your neck continue to enlarge.  You develop a rash, cough, or earache.  You cough up green, yellow-brown, or bloody sputum.  You have pain or discomfort not controlled by medicines.  Your problems seem to be getting worse rather than better.  You have a fever. SEEK IMMEDIATE MEDICAL CARE IF:   You develop any new symptoms such as vomiting, severe headache, stiff or painful neck, chest pain, shortness of breath, or trouble swallowing.  You develop severe  throat pain, drooling, or changes in your voice.  You develop swelling of the neck, or the skin on the neck becomes red and tender.  You develop signs of dehydration, such as fatigue, dry mouth, and decreased urination.  You become increasingly sleepy, or you cannot wake up completely. MAKE SURE YOU:  Understand these instructions.  Will watch your condition.  Will get help right away if you are not doing well or get worse. Document Released: 12/19/1999 Document Revised: 05/07/2013 Document Reviewed: 02/19/2010 [facility] Patient Information 2015  [place], [place]. This information is not intended to replace advice given to you by your health care provider. Make sure you discuss any questions you have with your health care provider.

## 2014-03-28 NOTE — Addendum Note (Signed)
Addended by: Nancy FetterNAFZIGER, Ritvik Mczeal L on: 03/28/2014 07:20 AM   Modules accepted: SmartSet

## 2015-12-30 ENCOUNTER — Ambulatory Visit: Payer: No Typology Code available for payment source | Admitting: Medical

## 2016-03-08 ENCOUNTER — Ambulatory Visit (INDEPENDENT_AMBULATORY_CARE_PROVIDER_SITE_OTHER): Payer: BLUE CROSS/BLUE SHIELD | Admitting: Allergy and Immunology

## 2016-03-08 ENCOUNTER — Encounter: Payer: Self-pay | Admitting: Allergy and Immunology

## 2016-03-08 VITALS — BP 122/82 | HR 74 | Temp 98.2°F | Resp 16 | Ht 65.0 in | Wt 147.2 lb

## 2016-03-08 DIAGNOSIS — T783XXA Angioneurotic edema, initial encounter: Secondary | ICD-10-CM

## 2016-03-08 DIAGNOSIS — K2970 Gastritis, unspecified, without bleeding: Secondary | ICD-10-CM

## 2016-03-08 DIAGNOSIS — L500 Allergic urticaria: Secondary | ICD-10-CM | POA: Diagnosis not present

## 2016-03-08 LAB — CBC WITH DIFFERENTIAL/PLATELET
Basophils Absolute: 71 cells/uL (ref 0–200)
Basophils Relative: 1 %
Eosinophils Absolute: 142 cells/uL (ref 15–500)
Eosinophils Relative: 2 %
HCT: 40 % (ref 35.0–45.0)
Hemoglobin: 13.2 g/dL (ref 11.7–15.5)
Lymphocytes Relative: 34 %
Lymphs Abs: 2414 cells/uL (ref 850–3900)
MCH: 31.3 pg (ref 27.0–33.0)
MCHC: 33 g/dL (ref 32.0–36.0)
MCV: 94.8 fL (ref 80.0–100.0)
MPV: 10.9 fL (ref 7.5–12.5)
Monocytes Absolute: 568 cells/uL (ref 200–950)
Monocytes Relative: 8 %
Neutro Abs: 3905 cells/uL (ref 1500–7800)
Neutrophils Relative %: 55 %
Platelets: 317 10*3/uL (ref 140–400)
RBC: 4.22 MIL/uL (ref 3.80–5.10)
RDW: 13.4 % (ref 11.0–15.0)
WBC: 7.1 10*3/uL (ref 3.8–10.8)

## 2016-03-08 LAB — COMPREHENSIVE METABOLIC PANEL
ALT: 14 U/L (ref 6–29)
AST: 17 U/L (ref 10–35)
Albumin: 4.4 g/dL (ref 3.6–5.1)
Alkaline Phosphatase: 44 U/L (ref 33–115)
BUN: 13 mg/dL (ref 7–25)
CO2: 23 mmol/L (ref 20–31)
Calcium: 9.3 mg/dL (ref 8.6–10.2)
Chloride: 103 mmol/L (ref 98–110)
Creat: 0.61 mg/dL (ref 0.50–1.10)
Glucose, Bld: 88 mg/dL (ref 65–99)
Potassium: 4 mmol/L (ref 3.5–5.3)
Sodium: 137 mmol/L (ref 135–146)
Total Bilirubin: 0.3 mg/dL (ref 0.2–1.2)
Total Protein: 7 g/dL (ref 6.1–8.1)

## 2016-03-08 MED ORDER — LEVOCETIRIZINE DIHYDROCHLORIDE 5 MG PO TABS: 5.0000 mg | ORAL_TABLET | Freq: Every evening | ORAL | 5 refills | Status: DC

## 2016-03-08 MED ORDER — RANITIDINE HCL 150 MG PO TABS: 150.0000 mg | ORAL_TABLET | Freq: Two times a day (BID) | ORAL | 3 refills | Status: DC

## 2016-03-08 NOTE — Progress Notes (Signed)
New Patient Note  RE: Jo [name] MRN: [id number] DOB: 06/17/1970 Date of Office Visit: 03/08/2016  Referring provider: Burnice Logan, MD Primary care provider: Jeb Levering, Philbert Riser, MD  Chief Complaint: Angioedema and Urticaria   History of present illness: Jo [name] is a 46 y.o. female seen today in consultation requested by Daniel Nones, MD.  Since spring of 2014 she has experienced recurrent episodes of lip swelling and hives.  She reports that over the past 3 weeks she has had 3 rather significant episodes of lip swelling.  In addition to these episodes, she has had many mild episodes of lip swelling as well as hives which appear on her back, chest, and abdomen.  It typically takes 2 or 3 days for the lip swelling to resolve and each episode of urticaria lasts for approximately 1 week.  Individual hives are erythematous, raised, and pruritic and resolve within 24 hours without residual pigmentation or bruising.  No specific medication, food, skin care product, detergent, soap, or other environmental triggers have been identified.  She has noticed that emotional stress seems to exacerbate the hives and swelling.  At times, the lip swelling is significant enough to cause the splitting of the lip and bleeding and as the swelling resolves, the lips become dry and cracked.  She believes that at times she may have a tiny blister or 2 which appear on the lips when swollen.  She does not experience concomitant cardiopulmonary or GI symptoms.  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   Assessment and plan: Recurrent urticaria Unclear etiology. NSAIDs commonly exacerbate urticaria and angioedema but are not the underlying etiology in this case.  Her symptoms are triggered by emotional stress.  Physical urticarias are negative by history (i.e. pressure-induced,  temperature, vibration, solar, etc.). There are no concomitant symptoms concerning for anaphylaxis or constitutional symptoms worrisome for an underlying malignancy. We will rule out other potential etiologies with labs. For symptom relief, patient is to take oral antihistamines as directed.  The following labs have been ordered: FCeRI antibody, TSH, anti-thyroglobulin antibody, thyroid peroxidase antibody, tryptase, urea breath test, C4, CBC, CMP, ESR, ANA, and galactose-alpha-1,3-galactose IgE level.  The patient will be called with further recommendations after lab results have returned.  Instructions have been discussed and provided for H1/H2 receptor blockade with titration to find lowest effective dose.  Prescriptions have been provided for levocetirizine and ranitidine.  A journal is to be kept recording any foods eaten, beverages consumed, medications taken within a 6 hour period prior to the onset of symptoms, as well as record activities being performed, and environmental conditions. For any symptoms concerning for anaphylaxis, 911 is to be called immediately.  Angioedema Associated angioedema occurs in up to 50% of patients with recurrent urticaria.  Treatment/diagnostic plan as outlined above.   Meds ordered this encounter  Medications  . levocetirizine (XYZAL) 5 MG tablet    Sig: Take 1 tablet (5 mg total) by mouth every evening.    Dispense:  30 tablet    Refill:  5  . ranitidine (ZANTAC) 150 MG tablet    Sig: Take 1 tablet (150 mg total) by mouth 2 (two) times daily.    Dispense:  60 tablet    Refill:  3    Diagnostics: Allergy skin testing in 2014 was unrevealing.     Physical examination: Blood pressure 122/82,  pulse 74, temperature 98.2 F (36.8 C), temperature source Oral, resp. rate 16, height '5\' 5"'$  (1.651 m), weight 147 lb 3.2 oz (66.8 kg).  General: Alert, interactive, in no acute distress. Neck: Supple without lymphadenopathy. Lungs: Clear to  auscultation without wheezing, rhonchi or rales. CV: Normal S1, S2 without murmurs. Abdomen: Nondistended, nontender. Skin: Warm and dry, without lesions or rashes. Extremities:  No clubbing, cyanosis or edema. Neuro:   Grossly intact.  Review of systems:  Review of systems negative except as noted in HPI / PMHx or noted below: Review of Systems  Constitutional: Negative.   HENT: Negative.   Eyes: Negative.   Respiratory: Negative.   Cardiovascular: Negative.   Gastrointestinal: Negative.   Genitourinary: Negative.   Musculoskeletal: Negative.   Skin: Negative.   Neurological: Negative.   Endo/Heme/Allergies: Negative.   Psychiatric/Behavioral: Negative.     Past medical history:  Past Medical History:  Diagnosis Date  . History of chicken pox    childhood  . Urticaria     Past surgical history:  Past Surgical History:  Procedure Laterality Date  . CESAREAN SECTION  2009  . COMBINED AUGMENTATION MAMMAPLASTY AND ABDOMINOPLASTY  2004    Family history: Family History  Problem Relation Age of Onset  . Hyperlipidemia Mother   . Hypertension Mother   . Hyperlipidemia Father   . Hypertension Father   . Diabetes Paternal Grandmother     Social history: Social History   Social History  . Marital status: Married    Spouse name: N/A  . Number of children: N/A  . Years of education: N/A   Occupational History  . Not on file.   Social History Main Topics  . Smoking status: Never Smoker  . Smokeless tobacco: Never Used  . Alcohol use Yes  . Drug use: No  . Sexual activity: Not on file   Other Topics Concern  . 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  There is no known mold or water damage in the home.  There are 4 dogs, 2 birds, and one frog in the home,  the dogs have access to her bedroom.  She is a nonsmoker.  Allergies as of 03/08/2016   No Known Allergies     Medication List       Accurate as of 03/08/16  4:46 PM. Always use your most recent med list.          amoxicillin 500 MG capsule Commonly known as:  AMOXIL Take 1 capsule (500 mg total) by mouth 3 (three) times daily.   FOLIC ACID PO Take by mouth.   GENERESS FE 0.8-25 MG-MCG tablet Generic drug:  Norethindrone-Ethinyl Estradiol-Fe Chew 1 tablet by mouth daily.   JUNEL 1.5/30 1.5-30 MG-MCG tablet Generic drug:  Norethindrone Acetate-Ethinyl Estradiol Take 1 tablet by mouth daily.   levocetirizine 5 MG tablet Commonly known as:  XYZAL Take 1 tablet (5 mg total) by mouth every evening.   ondansetron 4 MG tablet Commonly known as:  ZOFRAN Take 1 tablet (4 mg total) by mouth every 8 (eight) hours as needed for nausea or vomiting.   ranitidine 150 MG tablet Commonly known as:  ZANTAC Take 1 tablet (150 mg total) by mouth 2 (two) times daily.       Known medication allergies:  No Known Allergies  I appreciate the opportunity to take part in [facility] care. Please do not hesitate to contact me with questions.  Sincerely,   R. Edgar Frisk, MD

## 2016-03-08 NOTE — Assessment & Plan Note (Addendum)
Unclear etiology. NSAIDs commonly exacerbate urticaria and angioedema but are not the underlying etiology in this case.  Her symptoms are triggered by emotional stress.  Physical urticarias are negative by history (i.e. pressure-induced, temperature, vibration, solar, etc.). There are no concomitant symptoms concerning for anaphylaxis or constitutional symptoms worrisome for an underlying malignancy. We will rule out other potential etiologies with labs. For symptom relief, patient is to take oral antihistamines as directed.  The following labs have been ordered: FCeRI antibody, TSH, anti-thyroglobulin antibody, thyroid peroxidase antibody, tryptase, urea breath test, C4, CBC, CMP, ESR, ANA, and galactose-alpha-1,3-galactose IgE level.  The patient will be called with further recommendations after lab results have returned.  Instructions have been discussed and provided for H1/H2 receptor blockade with titration to find lowest effective dose.  Prescriptions have been provided for levocetirizine and ranitidine.  A journal is to be kept recording any foods eaten, beverages consumed, medications taken within a 6 hour period prior to the onset of symptoms, as well as record activities being performed, and environmental conditions. For any symptoms concerning for anaphylaxis, 911 is to be called immediately.

## 2016-03-08 NOTE — Patient Instructions (Addendum)
Recurrent urticaria Unclear etiology. NSAIDs commonly exacerbate urticaria and angioedema but are not the underlying etiology in this case.  Her symptoms are triggered by emotional stress.  Physical urticarias are negative by history (i.e. pressure-induced, temperature, vibration, solar, etc.). There are no concomitant symptoms concerning for anaphylaxis or constitutional symptoms worrisome for an underlying malignancy. We will rule out other potential etiologies with labs. For symptom relief, patient is to take oral antihistamines as directed.  The following labs have been ordered: FCeRI antibody, TSH, anti-thyroglobulin antibody, thyroid peroxidase antibody, tryptase, urea breath test, C4, CBC, CMP, ESR, ANA, and galactose-alpha-1,3-galactose IgE level.  The patient will be called with further recommendations after lab results have returned.  Instructions have been discussed and provided for H1/H2 receptor blockade with titration to find lowest effective dose.  Prescriptions have been provided for levocetirizine and ranitidine.  A journal is to be kept recording any foods eaten, beverages consumed, medications taken within a 6 hour period prior to the onset of symptoms, as well as record activities being performed, and environmental conditions. For any symptoms concerning for anaphylaxis, 911 is to be called immediately.  Angioedema Associated angioedema occurs in up to 50% of patients with recurrent urticaria.  Treatment/diagnostic plan as outlined above.   When lab results have returned the patient will be called with further recommendations and follow up instructions.   Urticaria (Hives)/ Angioedema (swelling)  . Levocetirizine (Xyzal) 5 mg twice a day and ranitidine (Zantac) 150 mg twice a day. If no symptoms for 7-14 days then decrease to. . Levocetirizine (Xyzal) 5 mg twice a day and ranitidine (Zantac) 150 mg once a day.  If no symptoms for 7-14 days then decrease  to. . Levocetirizine (Xyzal) 5 mg twice a day.  If no symptoms for 7-14 days then decrease to. . Levocetirizine (Xyzal) 5 mg once a day.  May use Benadryl (diphenhydramine) as needed for breakthrough symptoms       If symptoms return, then step up dosage  When lab results have returned the patient will be called with further recommendations and follow up instructions.

## 2016-03-08 NOTE — Assessment & Plan Note (Signed)
Associated angioedema occurs in up to 50% of patients with recurrent urticaria.  Treatment/diagnostic plan as outlined above. 

## 2016-03-09 LAB — ALLERGEN BANANA: Allergen Banana f92: <0.1 kU/L

## 2016-03-09 LAB — ANA: Anti Nuclear Antibody(ANA): NEGATIVE

## 2016-03-09 LAB — H. PYLORI BREATH TEST: H. pylori Breath Test: NOT DETECTED

## 2016-03-09 LAB — C4 COMPLEMENT: C4 Complement: 24 mg/dL (ref 15–57)

## 2016-03-09 LAB — TRYPTASE: Tryptase: 1.6 ug/L (ref <11)

## 2016-03-09 LAB — SEDIMENTATION RATE: Sed Rate: 4 mm/hr (ref 0–20)

## 2016-03-11 LAB — ALPHA-GAL PANEL
Beef IgE: <0.1 kU/L (ref <0.35)
Class: 0
Class: 0
Class: 0
Galactose-alpha-1,3-galactose IgE*: <0.1 kU/L (ref <0.35)
Lamb/Mutton IgE: <0.1 kU/L (ref <0.35)
Pork IgE: <0.1 kU/L (ref <0.35)

## 2016-03-15 LAB — CP CHRONIC URTICARIA INDEX PANEL
Histamine Release: <16 % (ref <16)
TSH: 2.95 mIU/L
Thyroglobulin Ab: <1 IU/mL (ref <2)
Thyroperoxidase Ab SerPl-aCnc: <1 IU/mL (ref <9)

## 2016-03-16 ENCOUNTER — Telehealth: Payer: Self-pay | Admitting: Allergy

## 2016-03-16 NOTE — Telephone Encounter (Signed)
Patient called and was wanting to  know results of lab work.

## 2016-03-16 NOTE — Telephone Encounter (Signed)
Please inform the patient that no laboratory results point to an underlying etiology. Continue H1/H2 receptor blockade, titrating to the lowest effective dose necessary to suppress urticaria. Should significant or new symptoms occur, a journal is to be kept recording any foods eaten, beverages consumed, medications taken, activities performed, and environmental conditions within a 6 hour time period prior to the onset of symptoms. For any symptoms concerning for anaphylaxis, 911 is to be called immediately. 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  Please have her assess symptom correlation to see if these foods may be causing/contributing to the problem. Follow up as directed. Please send lab results to patient's PCP. Thanks.

## 2016-03-17 NOTE — Telephone Encounter (Signed)
Left message for patient to call office about labs.

## 2016-03-18 ENCOUNTER — Telehealth: Payer: Self-pay

## 2016-03-18 MED ORDER — PREDNISONE 10 MG PO TABS: ORAL_TABLET | ORAL | 0 refills | Status: DC

## 2016-03-18 NOTE — Telephone Encounter (Signed)
Patient called.  She states Dr. Nunzio CobbsBobbitt offered her Prednisone but she declined due to possible weight gain.  Now patient wants Prednisone called in. Please advise.

## 2016-03-18 NOTE — Telephone Encounter (Signed)
Prednisone, 20 mg x 5 days, 10 mg x3 day, then stop.  Thanks.

## 2016-03-18 NOTE — Telephone Encounter (Signed)
Patient called.  Gave information.

## 2016-03-18 NOTE — Telephone Encounter (Signed)
Called in Prednisone. Patient informed.

## 2016-04-05 ENCOUNTER — Telehealth: Payer: Self-pay | Admitting: *Deleted

## 2016-04-05 NOTE — Telephone Encounter (Signed)
Please call in prednisone, 40 mg x3 days, 20 mg x1 day, 10 mg x1 day, then stop. Let her know that if she has mild symptoms she may take  per day for a few days and doesn't have to do the entire taper.

## 2016-04-05 NOTE — Telephone Encounter (Signed)
PLEASE ADVISE.

## 2016-04-05 NOTE — Telephone Encounter (Signed)
Pt is wondering if you can call in a prescription for prednisone for her. [place] the [place] and wants to have some on hand just in case her lips swell up again. [facility]. Pt is wondering if it can be ready by noon today.

## 2020-04-01 LAB — EXTERNAL GENERIC LAB PROCEDURE

## 2021-04-02 DIAGNOSIS — Z8669 Personal history of other diseases of the nervous system and sense organs: Secondary | ICD-10-CM | POA: Diagnosis not present

## 2021-04-02 DIAGNOSIS — L509 Urticaria, unspecified: Secondary | ICD-10-CM | POA: Diagnosis not present

## 2021-04-02 DIAGNOSIS — R220 Localized swelling, mass and lump, head: Secondary | ICD-10-CM | POA: Diagnosis not present

## 2022-03-03 DIAGNOSIS — R238 Other skin changes: Secondary | ICD-10-CM | POA: Diagnosis not present

## 2022-03-03 DIAGNOSIS — L820 Inflamed seborrheic keratosis: Secondary | ICD-10-CM | POA: Diagnosis not present

## 2022-05-12 DIAGNOSIS — L821 Other seborrheic keratosis: Secondary | ICD-10-CM | POA: Diagnosis not present

## 2022-05-12 DIAGNOSIS — D229 Melanocytic nevi, unspecified: Secondary | ICD-10-CM | POA: Diagnosis not present

## 2022-05-12 DIAGNOSIS — L814 Other melanin hyperpigmentation: Secondary | ICD-10-CM | POA: Diagnosis not present

## 2022-05-12 DIAGNOSIS — Z1283 Encounter for screening for malignant neoplasm of skin: Secondary | ICD-10-CM | POA: Diagnosis not present

## 2022-07-09 DIAGNOSIS — Z01419 Encounter for gynecological examination (general) (routine) without abnormal findings: Secondary | ICD-10-CM | POA: Diagnosis not present

## 2022-07-09 DIAGNOSIS — Z1231 Encounter for screening mammogram for malignant neoplasm of breast: Secondary | ICD-10-CM | POA: Diagnosis not present

## 2022-08-27 ENCOUNTER — Other Ambulatory Visit: Payer: Self-pay

## 2022-08-27 ENCOUNTER — Ambulatory Visit
Admission: RE | Admit: 2022-08-27 | Discharge: 2022-08-27 | Disposition: A | Discharge status: Home / Self Care | Payer: 59 | Source: Ambulatory Visit

## 2022-08-27 ENCOUNTER — Telehealth: Payer: Self-pay | Admitting: Family Medicine

## 2022-08-27 VITALS — BP 106/70 | HR 79 | Temp 98.1°F | Resp 16

## 2022-08-27 DIAGNOSIS — M5440 Lumbago with sciatica, unspecified side: Secondary | ICD-10-CM

## 2022-08-27 DIAGNOSIS — M62830 Muscle spasm of back: Secondary | ICD-10-CM

## 2022-08-27 MED ORDER — METHOCARBAMOL 500 MG PO TABS: 500.0000 mg | ORAL_TABLET | Freq: Three times a day (TID) | ORAL | 0 refills | Status: DC | PRN

## 2022-08-27 MED ORDER — PREDNISONE 10 MG (21) PO TBPK: ORAL_TABLET | Freq: Every day | ORAL | 0 refills | Status: DC

## 2022-08-27 NOTE — Telephone Encounter (Signed)
Medication initially intended to go with [facility].  Patient notified that Robaxin has been sent to pharmacy as well.

## 2022-08-27 NOTE — ED Triage Notes (Signed)
Chronic back pain with acute flare, low back pain that shoots down left leg

## 2022-08-27 NOTE — Telephone Encounter (Signed)
Patient medication sent to pharmacy per request.

## 2022-08-27 NOTE — Discharge Instructions (Addendum)
Advised patient to take medication as directed with food to completion.  Advised may use Robaxin daily or as needed for muscle spasms of lower back.  Encouraged to increase daily water intake to 64 ounces per day while taking these medications.

## 2022-08-27 NOTE — ED Provider Notes (Signed)
[name]    CSN: [id number] Arrival date & time: 08/27/22  1337      History   Chief Complaint Chief Complaint  Patient presents with   Back Pain    HPI Jo [name] is a 52 y.o. female.   HPI 52 year old female presents with chronic back pain with acute flare.  Reports lower back pain that shoots down the left leg.  PMH significant for anemia, hyperglycemia and hypercholesterolemia.  Past Medical History:  Diagnosis Date   History of chicken pox    childhood   Urticaria     Patient Active Problem List   Diagnosis Date Noted   Recurrent urticaria 03/08/2016   Angioedema 03/08/2016   Strep throat 03/27/2014   Blister of lip without infection 04/05/2012   Conjunctivitis 01/14/2012   Hyperglycemia 10/11/2010   Pure hypercholesterolemia 10/11/2010   Anemia 10/11/2010   Skin lesion 10/11/2010   ANEMIA-NOS 11/16/2007    Past Surgical History:  Procedure Laterality Date   CESAREAN SECTION  2009   COMBINED AUGMENTATION MAMMAPLASTY AND ABDOMINOPLASTY  2004    OB History   No obstetric history on file.      Home Medications    Prior to Admission medications   Medication Sig Start Date End Date Taking? Authorizing Provider  FOLIC ACID PO Take by mouth.    [provider]  JUNEL 1.5/30 1.5-30 MG-MCG tablet Take 1 tablet by mouth daily. 02/16/16   [provider]  methocarbamol (ROBAXIN) 500 MG tablet Take 1 tablet (500 mg total) by mouth 3 (three) times daily as needed. 08/27/22   Trevor Iha, FNP  Norethindrone-Ethinyl Estradiol-Fe (GENERESS FE) 0.8-25 MG-MCG tablet Chew 1 tablet by mouth daily.    [provider]  predniSONE (STERAPRED UNI-PAK 21 TAB) 10 MG (21) TBPK tablet Take by mouth daily. Take 6 tabs by mouth daily  for 2 days, then 5 tabs for 2 days, then 4 tabs for 2 days, then 3 tabs for 2 days, 2 tabs for 2 days, then 1 tab by mouth daily for 2 days 08/27/22   Trevor Iha, FNP  ranitidine (ZANTAC) 150 MG  tablet Take 1 tablet (150 mg total) by mouth 2 (two) times daily. 03/08/16   Bobbitt, Heywood Iles, MD    Family History Family History  Problem Relation Age of Onset   Hyperlipidemia Mother    Hypertension Mother    Hyperlipidemia Father    Hypertension Father    Diabetes Paternal Grandmother     Social History Social History   Tobacco Use   Smoking status: Never   Smokeless tobacco: Never  Substance Use Topics   Alcohol use: Yes   Drug use: No     Allergies   No known allergies   Review of Systems Review of Systems  Musculoskeletal:  Positive for back pain.     Physical Exam Triage Vital Signs ED Triage Vitals  Encounter Vitals Group     BP      Systolic BP Percentile      Diastolic BP Percentile      Pulse      Resp      Temp      Temp src      SpO2      Weight      Height      Head Circumference      Peak Flow      Pain Score      Pain Loc  Pain Education      Exclude from Growth Chart    No data found.  Updated Vital Signs BP 106/70 (BP Location: Left Arm)   Pulse 79   Temp 98.1 F (36.7 C) (Oral)   Resp 16   SpO2 98%    Physical Exam Vitals and nursing note reviewed.  Constitutional:      Appearance: Normal appearance. She is normal weight.  HENT:     Head: Normocephalic and atraumatic.     Mouth/Throat:     Mouth: Mucous membranes are moist.     Pharynx: Oropharynx is clear.  Eyes:     Extraocular Movements: Extraocular movements intact.     Conjunctiva/sclera: Conjunctivae normal.     Pupils: Pupils are equal, round, and reactive to light.  Cardiovascular:     Rate and Rhythm: Normal rate and regular rhythm.     Pulses: Normal pulses.     Heart sounds: Normal heart sounds.  Pulmonary:     Effort: Pulmonary effort is normal.     Breath sounds: Normal breath sounds. No wheezing, rhonchi or rales.  Musculoskeletal:        General: Normal range of motion.     Cervical back: Normal range of motion and neck supple.      Comments: Patient reporting pain over right lower back area to right hip right buttocks and right upper leg  Skin:    General: Skin is warm and dry.  Neurological:     General: No focal deficit present.     Mental Status: She is alert and oriented to person, place, and time. Mental status is at baseline.  Psychiatric:        Mood and Affect: Mood normal.        Behavior: Behavior normal.      UC Treatments / Results  Labs (all labs ordered are listed, but only abnormal results are displayed) Labs Reviewed - No data to display  EKG   Radiology No results found.  Procedures Procedures (including critical care time)  Medications Ordered in UC Medications - No data to display  Initial Impression / Assessment and Plan / UC Course  I have reviewed the triage vital signs and the nursing notes.  Pertinent labs & imaging results that were available during my care of the patient were reviewed by me and considered in my medical decision making (see chart for details).     MDM: 1.  Acute right-sided low back pain with sciatica, sciatica laterally specified-Rx'd Sterapred Unipak (tapering from 60 mg to 10 mg over 10 days; 2.  Muscle spasm of back-Rx'd Robaxin 500 mg tablet 3 times daily, as needed. Advised patient to take medication as directed with food to completion.  Advised may use Robaxin daily or as needed for muscle spasms of lower back.  Encouraged to increase daily water intake to 64 ounces per day while taking these medications.  Patient discharged home, hemodynamically stable.   Final Clinical Impressions(s) / UC Diagnoses   Final diagnoses:  Acute right-sided low back pain with sciatica, sciatica laterality unspecified  Muscle spasm of back     Discharge Instructions      Advised patient to take medication as directed with food to completion.  Advised may use Robaxin daily or as needed for muscle spasms of lower back.  Encouraged to increase daily water intake to 64  ounces per day while taking these medications.     ED Prescriptions   None    PDMP  not reviewed this encounter.   Trevor Iha, FNP 08/27/22 1857

## 2022-08-31 ENCOUNTER — Encounter: Payer: Self-pay | Admitting: Family Medicine

## 2022-08-31 ENCOUNTER — Ambulatory Visit: Payer: 59 | Admitting: Family Medicine

## 2022-08-31 VITALS — BP 117/82 | HR 84 | Temp 98.1°F | Resp 16 | Ht 65.0 in | Wt 140.0 lb

## 2022-08-31 DIAGNOSIS — G8929 Other chronic pain: Secondary | ICD-10-CM

## 2022-08-31 DIAGNOSIS — M5442 Lumbago with sciatica, left side: Secondary | ICD-10-CM

## 2022-08-31 NOTE — Assessment & Plan Note (Addendum)
Chronic since 2020. She is currently on steroid taper per urgent care. Pain is much better. Continues to have positive straight leg raise on left. Will refer to ortho today for further evaluation. No bowel or bladder involvement.

## 2022-08-31 NOTE — Progress Notes (Signed)
New Patient Office Visit  Subjective    Patient ID: Jo [name], female    DOB: 1970-03-08  Age: 52 y.o. MRN: [id number]  CC:  Chief Complaint  Patient presents with   Establish Care    HPI Jo [name] presents to establish care with this practice. She is new to me. She works as an Pensions consultant. Lives a healthy lifestyle. Not on chronic medications.   Back pain: on Aug 2020 has been having pain since bending down to pick up dog. Has been using ice, heat, tylenol and ibuprofen without relief. Went to Urgent care on 8/23  got prednisone taper. Continues to having some "twitch" on left side of low back.  Pain is much better with steroid taper.   Chart review: 08/27/22: urgent care visit.  06/2020: positive cologuard, had colonoscopy (will get documentation for this).    History reviewed. Medications reviewed.    Outpatient Encounter Medications as of 08/31/2022  Medication Sig   FOLIC ACID PO Take by mouth.   predniSONE (STERAPRED UNI-PAK 21 TAB) 10 MG (21) TBPK tablet Take by mouth daily. Take 6 tabs by mouth daily  for 2 days, then 5 tabs for 2 days, then 4 tabs for 2 days, then 3 tabs for 2 days, 2 tabs for 2 days, then 1 tab by mouth daily for 2 days   ranitidine (ZANTAC) 150 MG tablet Take 1 tablet (150 mg total) by mouth 2 (two) times daily.   JUNEL 1.5/30 1.5-30 MG-MCG tablet Take 1 tablet by mouth daily. (Patient not taking: Reported on 08/31/2022)   methocarbamol (ROBAXIN) 500 MG tablet Take 1 tablet (500 mg total) by mouth 3 (three) times daily as needed.   Norethindrone-Ethinyl Estradiol-Fe (GENERESS FE) 0.8-25 MG-MCG tablet Chew 1 tablet by mouth daily.   No facility-administered encounter medications on file as of 08/31/2022.    Past Medical History:  Diagnosis Date   History of chicken pox    childhood   Urticaria    Uveitis     Past Surgical History:  Procedure Laterality Date   CESAREAN SECTION  2009   COMBINED AUGMENTATION MAMMAPLASTY AND ABDOMINOPLASTY   2004    Family History  Problem Relation Age of Onset   Hyperlipidemia Mother    Hypertension Mother    Hyperlipidemia Father    Hypertension Father    Diabetes Paternal Grandmother     Social History   Socioeconomic History   Marital status: Divorced    Spouse name: Not on file   Number of children: 2   Years of education: Not on file   Highest education level: Not on file  Occupational History   Not on file  Tobacco Use   Smoking status: Never   Smokeless tobacco: Never  Substance and Sexual Activity   Alcohol use: Not Currently   Drug use: No   Sexual activity: Not Currently  Other Topics Concern   Not on file  Social History Narrative    Married 4.5 years-currently separated   [employer]   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   Social Determinants of Health   Financial Resource Strain: Not on file  Food Insecurity: No Food Insecurity (04/02/2021)   Received from [facility]   Hunger Vital Sign    Worried About Running Out of Food in the Last Year: Never true    Ran Out of Food in the Last Year: Never true  Transportation Needs: Not on file  Physical Activity: Not on file  Stress: Not on file  Social Connections: Unknown (05/19/2021)   [facility]   Social Network    Social Network: Not on file  Intimate Partner Violence: Unknown (04/06/2021)   Received from [facility]   HITS    Physically Hurt: Not on file    Insult or Talk Down To: Not on file    Threaten Physical Harm: Not on file    Scream or Curse: Not on file    Review of Systems  Eyes:  Negative for blurred vision and double vision.  Respiratory:  Negative for shortness of breath.   Cardiovascular:  Positive for palpitations (had EKG). Negative for chest pain.  Gastrointestinal:  Negative for nausea and vomiting.  Musculoskeletal:  Positive for back pain. Negative for falls.  Neurological:  Negative for tingling, sensory change and weakness.  Psychiatric/Behavioral:  Negative for  depression and suicidal ideas. The patient is not nervous/anxious.         Objective    BP 117/82   Pulse 84   Temp 98.1 F (36.7 C) (Oral)   Resp 16   Ht 5\' 5"  (1.651 m)   Wt 140 lb (63.5 kg)   SpO2 99%   BMI 23.30 kg/m   Physical Exam Vitals and nursing note reviewed.  Constitutional:      General: She is not in acute distress.    Appearance: Normal appearance. She is normal weight.  Cardiovascular:     Rate and Rhythm: Regular rhythm.  Pulmonary:     Effort: Pulmonary effort is normal.  Musculoskeletal:     Cervical back: No bony tenderness.     Thoracic back: No bony tenderness.     Lumbar back: No bony tenderness. Positive left straight leg raise test. Negative right straight leg raise test.     Comments: Normal gait. Normal coordination. ROM normal. 5/5 upper and lower extremity strength. Positive straight leg raise on left.  No bony spinous process tenderness. No loss of sensation.   Skin:    General: Skin is warm and dry.  Neurological:     General: No focal deficit present.     Mental Status: She is alert. Mental status is at baseline.  Psychiatric:        Mood and Affect: Mood normal.        Behavior: Behavior normal.        Thought Content: Thought content normal.        Judgment: Judgment normal.        Assessment & Plan:   Problem List Items Addressed This Visit     Chronic left-sided low back pain with left-sided sciatica - Primary    Chronic since 2020. She is currently on steroid taper per urgent care. Pain is much better. Continues to have positive straight leg raise on left. Will refer to ortho today for further evaluation. No bowel or bladder involvement.       Relevant Orders   Ambulatory referral to Orthopedics  Will get medical records sent to this office for health maintenance. Up to date colonoscopy, mammogram, and pap smear. Will verify once records are received.   Agrees with plan of care discussed.  Questions  answered.   Return in about 4 weeks (around 09/28/2022) for CPE with labs.   Novella Olive, FNP

## 2022-09-01 ENCOUNTER — Ambulatory Visit: Payer: 59 | Admitting: Family Medicine

## 2022-09-15 ENCOUNTER — Ambulatory Visit (HOSPITAL_BASED_OUTPATIENT_CLINIC_OR_DEPARTMENT_OTHER): Payer: 59 | Admitting: Orthopaedic Surgery

## 2022-09-20 ENCOUNTER — Other Ambulatory Visit (HOSPITAL_BASED_OUTPATIENT_CLINIC_OR_DEPARTMENT_OTHER): Payer: Self-pay

## 2022-09-20 ENCOUNTER — Ambulatory Visit (INDEPENDENT_AMBULATORY_CARE_PROVIDER_SITE_OTHER): Payer: 59 | Admitting: Orthopaedic Surgery

## 2022-09-20 ENCOUNTER — Ambulatory Visit (INDEPENDENT_AMBULATORY_CARE_PROVIDER_SITE_OTHER): Payer: 59

## 2022-09-20 DIAGNOSIS — G8929 Other chronic pain: Secondary | ICD-10-CM

## 2022-09-20 DIAGNOSIS — M5442 Lumbago with sciatica, left side: Secondary | ICD-10-CM

## 2022-09-20 DIAGNOSIS — M545 Low back pain, unspecified: Secondary | ICD-10-CM | POA: Diagnosis not present

## 2022-09-20 DIAGNOSIS — M438X6 Other specified deforming dorsopathies, lumbar region: Secondary | ICD-10-CM | POA: Diagnosis not present

## 2022-09-20 DIAGNOSIS — M47816 Spondylosis without myelopathy or radiculopathy, lumbar region: Secondary | ICD-10-CM | POA: Diagnosis not present

## 2022-09-20 MED ORDER — LORAZEPAM 1 MG PO TABS
1.0000 mg | ORAL_TABLET | Freq: Three times a day (TID) | ORAL | 0 refills | Status: AC
  Filled 2022-09-20: qty 30, 10d supply, fill #0

## 2022-09-20 MED ORDER — METHOCARBAMOL 500 MG PO TABS
500.0000 mg | ORAL_TABLET | Freq: Four times a day (QID) | ORAL | 3 refills | Status: DC
  Filled 2022-09-20: qty 30, 8d supply, fill #0

## 2022-09-20 NOTE — Progress Notes (Signed)
Chief Complaint: Left leg pain     History of Present Illness:    Jo [name] is a 52 y.o. female presents today with radiating pain down the left leg.  This has been going on since 2020 at which point she bent down to pick up her child and felt an immediate sharp pain go down the leg.  She states that this has been fluctuating and going down the leg particular in periods of stress.  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  She states that the pain is now radiating down the left foot.  She has been taking steroid which is helping somewhat.    Surgical History:   None  PMH/PSH/Family History/Social History/Meds/Allergies:    Past Medical History:  Diagnosis Date   History of chicken pox    childhood   Urticaria    Uveitis    Past Surgical History:  Procedure Laterality Date   CESAREAN SECTION  2009   COMBINED AUGMENTATION MAMMAPLASTY AND ABDOMINOPLASTY  2004   Social History   Socioeconomic History   Marital status: Divorced    Spouse name: Not on file   Number of children: 2   Years of education: Not on file   Highest education level: Not on file  Occupational History   Not on file  Tobacco Use   Smoking status: Never   Smokeless tobacco: Never  Substance and Sexual Activity   Alcohol use: Not Currently   Drug use: No   Sexual activity: Not Currently  Other Topics Concern   Not on file  Social History Narrative    Married 4.5 years-currently separated   [employer]   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   Social Determinants of Health   Financial Resource Strain: Not on file  Food Insecurity: No Food Insecurity (04/02/2021)   Received from [facility]   Hunger Vital Sign    Worried About Running Out of Food in the Last Year: Never true    Ran Out of Food in the Last Year: Never true  Transportation Needs: Not on file  Physical Activity: Not on file   Stress: Not on file  Social Connections: Unknown (05/19/2021)   Received from [facility]   Social Network    Social Network: Not on file   Family History  Problem Relation Age of Onset   Hyperlipidemia Mother    Hypertension Mother    Hyperlipidemia Father    Hypertension Father    Diabetes Paternal Grandmother    Allergies  Allergen Reactions   No Known Allergies    Current Outpatient Medications  Medication Sig Dispense Refill   LORazepam (ATIVAN) 1 MG tablet Take 1 tablet (1 mg total) by mouth every 8 (eight) hours. 30 tablet 0   methocarbamol (ROBAXIN) 500 MG tablet Take 1 tablet (500 mg total) by mouth 4 (four) times daily. 30 tablet 3   FOLIC ACID PO Take by mouth.     JUNEL 1.5/30 1.5-30 MG-MCG tablet Take 1 tablet by mouth daily. (Patient not taking: Reported on 08/31/2022)  3   methocarbamol (ROBAXIN) 500 MG tablet Take 1 tablet (500 mg total) by mouth 3 (three) times daily as needed. 30 tablet 0   Norethindrone-Ethinyl Estradiol-Fe (GENERESS FE) 0.8-25 MG-MCG tablet  Chew 1 tablet by mouth daily.     predniSONE (STERAPRED UNI-PAK 21 TAB) 10 MG (21) TBPK tablet Take by mouth daily. Take 6 tabs by mouth daily  for 2 days, then 5 tabs for 2 days, then 4 tabs for 2 days, then 3 tabs for 2 days, 2 tabs for 2 days, then 1 tab by mouth daily for 2 days 42 tablet 0   ranitidine (ZANTAC) 150 MG tablet Take 1 tablet (150 mg total) by mouth 2 (two) times daily. 60 tablet 3   No current facility-administered medications for this visit.   DG Lumbar Spine Complete  Result Date: 09/20/2022 CLINICAL DATA:  Chronic low back pain. EXAM: LUMBAR SPINE - COMPLETE 4+ VIEW COMPARISON:  None Available. FINDINGS: There is no evidence of lumbar spine fracture. Curvature of spine. Mild narrow intervertebral spaces at L2-3, L3-4, L4-5. Minimal anterior spurring noted at L4. IMPRESSION: Mild degenerative joint changes of lumbar spine. Electronically Signed   By: Sherian Rein M.D.   On: 09/20/2022  09:37    Review of Systems:   A ROS was performed including pertinent positives and negatives as documented in the HPI.  Physical Exam :   Constitutional: NAD and appears stated age Neurological: Alert and oriented Psych: Appropriate affect and cooperative There were no vitals taken for this visit.   Comprehensive Musculoskeletal Exam:    Positive straight leg raise on the left.  Strong dorsi and plantarflexion of the left foot.  Remainder of distal neurosensory exam is intact  Imaging:     I personally reviewed and interpreted the radiographs.   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  At this time she is having a very difficult time walking and doing her job.  She is no longer able to wear heels.  I would like to plan to obtain a lumbar spine MRI.  I have also recommended additional symptomatic treatment such as an inversion table  Plan :    -Return to clinic following lumbar MRI     I personally saw and evaluated the patient, and participated in the management and treatment plan.  Huel Cote, MD Attending Physician, Orthopedic Surgery  This document was dictated using Dragon voice recognition software. A reasonable attempt at proof reading has been made to minimize errors.

## 2022-10-08 ENCOUNTER — Encounter: Payer: 59 | Admitting: Family Medicine

## 2022-10-08 ENCOUNTER — Telehealth: Payer: Self-pay | Admitting: Orthopaedic Surgery

## 2022-10-08 NOTE — Telephone Encounter (Signed)
Please advise 

## 2022-10-08 NOTE — Telephone Encounter (Signed)
PT needs a new referral for a different location the one we sent was approved but did not accept her insurance please advise pt has extreme anxiety and needs a open MRI

## 2022-10-11 NOTE — Telephone Encounter (Signed)
Order emailed to [facility] at [facility]

## 2022-11-02 ENCOUNTER — Telehealth: Payer: Self-pay | Admitting: Orthopaedic Surgery

## 2022-11-02 NOTE — Telephone Encounter (Signed)
Pt called in found a imaging place that accepts her insurance per [employer] she called, the place is [facility] in [place] phone number is [phone]

## 2022-11-03 NOTE — Telephone Encounter (Signed)
Pt aware I will fax order over to [facility] imaging in [facility] as requested and insurance will cover.

## 2022-12-08 ENCOUNTER — Other Ambulatory Visit (HOSPITAL_BASED_OUTPATIENT_CLINIC_OR_DEPARTMENT_OTHER): Payer: Self-pay | Admitting: Orthopaedic Surgery

## 2022-12-08 ENCOUNTER — Other Ambulatory Visit (HOSPITAL_BASED_OUTPATIENT_CLINIC_OR_DEPARTMENT_OTHER): Payer: Self-pay | Admitting: Student

## 2022-12-08 ENCOUNTER — Ambulatory Visit: Payer: Self-pay

## 2022-12-08 ENCOUNTER — Telehealth (HOSPITAL_BASED_OUTPATIENT_CLINIC_OR_DEPARTMENT_OTHER): Payer: Self-pay | Admitting: Orthopaedic Surgery

## 2022-12-08 ENCOUNTER — Telehealth: Payer: Self-pay | Admitting: Orthopaedic Surgery

## 2022-12-08 MED ORDER — PREDNISONE 10 MG (21) PO TBPK: ORAL_TABLET | Freq: Every day | ORAL | 0 refills | Status: DC

## 2022-12-08 MED ORDER — METHYLPREDNISOLONE 4 MG PO TBPK: ORAL_TABLET | ORAL | 0 refills | Status: DC

## 2022-12-08 NOTE — Telephone Encounter (Signed)
Patient wants to know if she can get a steroid for her pain she said she can barley stand. Please contact patient [phone]. I advised that when approved it would be sent to her mychart and she ask how long  I advise that I was not sure on the length of time.

## 2022-12-08 NOTE — Telephone Encounter (Signed)
Attempted CB and line disconnected to inform her Rx was resent to pharmacy

## 2022-12-08 NOTE — Telephone Encounter (Signed)
Steroid pack was sent to [facility] in [facility] and called patient to inform and scheduled her for MRI review appt

## 2022-12-08 NOTE — Telephone Encounter (Signed)
  Patient called. She would like something for pain sent [facility] to [facility] for her. Her cb# [phone]

## 2022-12-08 NOTE — Telephone Encounter (Signed)
Patient states that the pharmacy does not have enough information for the meds that was sent in

## 2022-12-08 NOTE — Telephone Encounter (Signed)
Patient returned call and I informed her new Rx was sent by Hazle Nordmann, PA

## 2022-12-09 ENCOUNTER — Other Ambulatory Visit (HOSPITAL_BASED_OUTPATIENT_CLINIC_OR_DEPARTMENT_OTHER): Payer: Self-pay | Admitting: Orthopaedic Surgery

## 2023-01-01 DIAGNOSIS — M4726 Other spondylosis with radiculopathy, lumbar region: Secondary | ICD-10-CM | POA: Diagnosis not present

## 2023-01-01 DIAGNOSIS — M5116 Intervertebral disc disorders with radiculopathy, lumbar region: Secondary | ICD-10-CM | POA: Diagnosis not present

## 2023-01-10 ENCOUNTER — Ambulatory Visit (INDEPENDENT_AMBULATORY_CARE_PROVIDER_SITE_OTHER): Payer: 59 | Admitting: Orthopaedic Surgery

## 2023-01-10 DIAGNOSIS — G8929 Other chronic pain: Secondary | ICD-10-CM

## 2023-01-10 DIAGNOSIS — M5442 Lumbago with sciatica, left side: Secondary | ICD-10-CM

## 2023-01-10 NOTE — Progress Notes (Signed)
 Chief Complaint: Left leg pain     History of Present Illness:   01/10/2023: Presents today for MRI of her lumbar spine review.  She is feeling better with steroids although still having some symptoms particularly down the left leg.  Jo [name] is a 53 y.o. female presents today with radiating pain down the left leg.  This has been going on since 2020 at which point she bent down to pick up her child and felt an immediate sharp pain go down the leg.  She states that this has been fluctuating and going down the leg particular in periods of stress.  She works as an pensions consultant.  She does enjoy being active and works with horses although this is been also very limiting.  She states that the pain is now radiating down the left foot.  She has been taking steroid which is helping somewhat.    Surgical History:   None  PMH/PSH/Family History/Social History/Meds/Allergies:    Past Medical History:  Diagnosis Date   History of chicken pox    childhood   Urticaria    Uveitis    Past Surgical History:  Procedure Laterality Date   CESAREAN SECTION  2009   COMBINED AUGMENTATION MAMMAPLASTY AND ABDOMINOPLASTY  2004   Social History   Socioeconomic History   Marital status: Divorced    Spouse name: Not on file   Number of children: 2   Years of education: Not on file   Highest education level: Not on file  Occupational History   Not on file  Tobacco Use   Smoking status: Never   Smokeless tobacco: Never  Substance and Sexual Activity   Alcohol use: Not Currently   Drug use: No   Sexual activity: Not Currently  Other Topics Concern   Not on file  Social History Narrative    Married 4.5 years-currently separated   [employer]   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   Social Drivers of Health   Financial Resource Strain: Not on file  Food Insecurity: No Food Insecurity (04/02/2021)   Received from [facility]   Hunger Vital Sign    Worried About Running  Out of Food in the Last Year: Never true    Ran Out of Food in the Last Year: Never true  Transportation Needs: Not on file  Physical Activity: Not on file  Stress: Not on file  Social Connections: Unknown (05/19/2021)   Received from [employer]   Social Network    Social Network: Not on file   Family History  Problem Relation Age of Onset   Hyperlipidemia Mother    Hypertension Mother    Hyperlipidemia Father    Hypertension Father    Diabetes Paternal Grandmother    Allergies  Allergen Reactions   No Known Allergies    Current Outpatient Medications  Medication Sig Dispense Refill   methylPREDNISolone  (MEDROL  DOSEPAK) 4 MG TBPK tablet Take per packet instructions 1 each 0   FOLIC ACID PO Take by mouth.     JUNEL 1.5/30 1.5-30 MG-MCG tablet Take 1 tablet by mouth daily. (Patient not taking: Reported on 08/31/2022)  3   LORazepam  (ATIVAN ) 1 MG tablet Take 1 tablet (1 mg total) by mouth every 8 (eight) hours. 30 tablet 0   methocarbamol  (ROBAXIN ) 500 MG tablet Take 1 tablet (500 mg  total) by mouth 3 (three) times daily as needed. 30 tablet 0   methocarbamol  (ROBAXIN ) 500 MG tablet Take 1 tablet (500 mg total) by mouth 4 (four) times daily. 30 tablet 3   Norethindrone-Ethinyl Estradiol-Fe (GENERESS FE) 0.8-25 MG-MCG tablet Chew 1 tablet by mouth daily.     predniSONE  (DELTASONE ) 10 MG tablet TAKE BY MOUTH DAILY. TAKE 6 TABS BY MOUTH DAILY FOR 2 DAYS, THEN 5 TABS FOR 2 DAYS, THEN 4 TABS FOR 2 DAYS, THEN 3 TABS FOR 2 DAYS, 2 TABS FOR 2 DAYS, THEN 1 TAB BY MOUTH DAILY FOR 2 DAYS 21 tablet 0   ranitidine  (ZANTAC ) 150 MG tablet Take 1 tablet (150 mg total) by mouth 2 (two) times daily. 60 tablet 3   No current facility-administered medications for this visit.   No results found.  Review of Systems:   A ROS was performed including pertinent positives and negatives as documented in the HPI.  Physical Exam :   Constitutional: NAD and appears stated age Neurological: Alert and  oriented Psych: Appropriate affect and cooperative There were no vitals taken for this visit.   Comprehensive Musculoskeletal Exam:    Positive straight leg raise on the left.  Strong dorsi and plantarflexion of the left foot.  Remainder of distal neurosensory exam is intact  Imaging:    Lumbar MRI spine: There is a left-sided L4-L5 disc herniation paracentral  I personally reviewed and interpreted the radiographs.   Assessment:   53 y.o. female with evidence of a lumbar disc herniation which has now failed multiple rounds of medical management.  At this time she would like to trial physical therapy and stretching program which I have advised him.  We did also talk about an inversion table as well.  I have advised that she can call me if she would like an additional transforaminal injection as well.  I will also plan to send 1 additional steroid injection Plan :    -Return to clinic as needed     I personally saw and evaluated the patient, and participated in the management and treatment plan.  Elspeth Parker, MD Attending Physician, Orthopedic Surgery  This document was dictated using Dragon voice recognition software. A reasonable attempt at proof reading has been made to minimize errors.

## 2023-01-18 ENCOUNTER — Other Ambulatory Visit (HOSPITAL_BASED_OUTPATIENT_CLINIC_OR_DEPARTMENT_OTHER): Payer: Self-pay | Admitting: Orthopaedic Surgery

## 2023-01-19 ENCOUNTER — Encounter: Payer: Self-pay | Admitting: Rehabilitative and Restorative Service Providers"

## 2023-01-19 ENCOUNTER — Other Ambulatory Visit: Payer: Self-pay

## 2023-01-19 ENCOUNTER — Ambulatory Visit: Payer: 59 | Attending: Orthopaedic Surgery | Admitting: Rehabilitative and Restorative Service Providers"

## 2023-01-19 DIAGNOSIS — M5442 Lumbago with sciatica, left side: Secondary | ICD-10-CM | POA: Insufficient documentation

## 2023-01-19 DIAGNOSIS — M5459 Other low back pain: Secondary | ICD-10-CM | POA: Insufficient documentation

## 2023-01-19 DIAGNOSIS — G8929 Other chronic pain: Secondary | ICD-10-CM | POA: Diagnosis not present

## 2023-01-19 DIAGNOSIS — M6281 Muscle weakness (generalized): Secondary | ICD-10-CM | POA: Diagnosis not present

## 2023-01-19 NOTE — Therapy (Signed)
 OUTPATIENT PHYSICAL THERAPY THORACOLUMBAR EVALUATION   Patient Name: Jo [name] MRN: [id number] DOB:1970-11-14, 53 y.o., female Today's Date: 01/19/2023  END OF SESSION:  PT End of Session - 01/19/23 1312     Visit Number 1    Number of Visits 16    Date for PT Re-Evaluation 03/20/23    Authorization Type [employer]- 30 visits/year    PT Start Time 1314    PT Stop Time 1400    PT Time Calculation (min) 46 min    Activity Tolerance Patient tolerated treatment well    Behavior During Therapy [facility] for tasks assessed/performed            Past Medical History:  Diagnosis Date   History of chicken pox    childhood   Urticaria    Uveitis    Past Surgical History:  Procedure Laterality Date   CESAREAN SECTION  2009   COMBINED AUGMENTATION MAMMAPLASTY AND ABDOMINOPLASTY  2004   Patient Active Problem List   Diagnosis Date Noted   Chronic left-sided low back pain with left-sided sciatica 08/31/2022   Recurrent urticaria 03/08/2016   Angioedema 03/08/2016   Strep throat 03/27/2014   Blister of lip without infection 04/05/2012   Conjunctivitis 01/14/2012   Hyperglycemia 10/11/2010   Pure hypercholesterolemia 10/11/2010   Anemia 10/11/2010   Skin lesion 10/11/2010   ANEMIA-NOS 11/16/2007    PCP: Buddie Carina, NP REFERRING PROVIDER: Wilhelmenia Harada, MD REFERRING DIAG:    [phone] (ICD-10-CM) - Chronic left-sided low back pain with left-sided sciatica    Rationale for Evaluation and Treatment: Rehabilitation  THERAPY DIAG:  Muscle weakness (generalized)  Other low back pain  ONSET DATE: 01/10/2023  SUBJECTIVE:                                                                                                                                                                                           SUBJECTIVE STATEMENT: The patient reports she had onset of L gluteal pain in 2020, but has always been able to get it to go away. It began the past summer and has  remained constant. She describes pain in the sacral region that radiates into the left leg. The pain stops behind the knee, however, she does get some pain in her left foot. She uses ice and heat to manage symptoms.  She is active at work standing x hours. She also has horses and lifts heavy items around the home/land (does get pain later in the day after lifting).   PERTINENT HISTORY:  N/a  PAIN:  Are you having pain? Yes: NPRS scale: 7/10 at night, varies during the day  Pain location: L gluteal region into posterior leg to knee Pain description: can be shooting/sharp intermittently, consistent dull pain Aggravating factors: initially worse with standing, now prolonged sitting can irritate pain  Relieving factors: takes 2 tylenol and 1 advil every morning   PRECAUTIONS: None  WEIGHT BEARING RESTRICTIONS: No  FALLS:  Has patient fallen in last 6 months? No  LIVING ENVIRONMENT: Lives with: lives with their family Lives in: House/apartment  OCCUPATION: attorney  PLOF: Independent  PATIENT GOALS: reduce pain  OBJECTIVE:  Note: Objective measures were completed at Evaluation unless otherwise noted.  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 MRI IMPRESSION: 1. L4-L5 left foraminal disc extrusion that contacts and displaces the emerging L4 root and results in moderate to severe foraminal stenosis. 2. L3-L4 left foraminal disc protrusion resulting in mild foraminal stenosis. 3. Multilevel spondylosis in the remaining levels as described above without high-grade canal or foraminal stenosis.   PATIENT SURVEYS:  FOTO 94%-- patient does not limit activities -- she does them with pain  SENSATION: WFL  MUSCLE LENGTH: Hamstrings: Right 0 deg; Left -20  deg from full extension  L tightness in quad  POSTURE: No Significant postural limitations  PALPATION: Tender L4-L5 paraspinal musculature Hypomobile PA lumbar spine  LUMBAR ROM:   AROM eval  Flexion WNLs  Extension painful  Right lateral flexion WNLs  Left lateral flexion WNLs  Right rotation WNLs  Left rotation WNLs   (Blank rows = not tested)  LOWER EXTREMITY ROM:   WFLs  LOWER EXTREMITY MMT:   MMT Right eval Left eval  Hip flexion 4/5 3+/5  Hip extension 4/5 3+/5  Hip abduction 5/5 4/5  Hip adduction    Hip internal rotation    Hip external rotation    Knee flexion 5/5 5/5  Knee extension 5/5 5/5  Ankle dorsiflexion 4/5 3+/5  Ankle plantarflexion 4/5 4/5  Ankle inversion    Ankle eversion     (Blank rows = not tested)  LUMBAR SPECIAL TESTS:  Slump test: Negative and Single leg stance test: >20 seconds bilaterally   [facility] Adult PT Treatment:                                                DATE: 01/19/23 Therapeutic Exercise: See HEP below   PATIENT EDUCATION:  Education details: HEP, plan of care, discussed dry needling and traction as potential interventions if needed Person educated: Patient Education method: Explanation, Demonstration, and Handouts Education comprehension: verbalized understanding, returned demonstration, and needs further education  HOME EXERCISE PROGRAM: Access Code: [email or web address] URL: [email or web address] Date: 01/19/2023 Prepared by: Trygve Gage  Exercises - Cat Cow  - 1 x daily - 7 x weekly - 1 sets - 10 reps - Primal Push Up  - 1 x daily - 7 x weekly - 1 sets - 8-10 reps - 5-10 seconds hold - Child's Pose with Sidebending  - 1 x daily - 7 x weekly - 1 sets - 3 reps - 30 seconds hold - Seated Hamstring Stretch with Chair  - 1 x daily - 7 x weekly - 1 sets - 3 reps - 30 seconds hold  ASSESSMENT:  CLINICAL IMPRESSION: Patient is a 53 y.o. female who was seen today for physical therapy evaluation and treatment for L low back pain with sciatica. She presents with pain that is constant  in nature, that increases with heavy lifting, and being in a prolonged position (sitting or standing). She has LE  weakness greater on the L side as compared to the R side, hypomobility lumbar spine, shortened L hamstring musculature.   OBJECTIVE IMPAIRMENTS: decreased activity tolerance, decreased ROM, decreased strength, hypomobility, increased fascial restrictions, impaired flexibility, and pain.   ACTIVITY LIMITATIONS:  none--pain with activities  PARTICIPATION LIMITATIONS:  none  PERSONAL FACTORS:  n/a  REHAB POTENTIAL: Good  CLINICAL DECISION MAKING: Stable/uncomplicated  EVALUATION COMPLEXITY: Low   GOALS: Goals reviewed with patient? Yes  SHORT TERM GOALS: Target date:02/19/23  The patient will be indep with HEP. Baseline: initiated at eval. Goal status: INITIAL  2.  The patient will report pain in the evening < or equal to 3/10. Baseline:  7/10 in evenings Goal status: INITIAL  3.  The patient will improve L HS length to -10 degrees from full extension (supine 90/90 position). Baseline:  -20 Goal status: INITIAL   LONG TERM GOALS: Target date: 03/20/23  The patient will be indep with HEP progression. Baseline:  initiated at eval. Goal status: INITIAL  2.  The patient will maintain FOTO score of 94%. Baseline:  not limited in activities, but does have pain with chores. Goal status: INITIAL  3.  The patient will improve hip flexion strength to 5/5 bilaterally. Baseline:  see MMT chart Goal status: INITIAL  4.  The patient will improve bilateral ankle DF to 5/5. Baseline: see MMT chart Goal status: INITIAL  5.  The patient will report resolution of pain in L thigh/gluteal and sacral region. Baseline:  varies Goal status: INITIAL  PLAN:  PT FREQUENCY: 2x/week  PT DURATION: 8 weeks  PLANNED INTERVENTIONS: 97164- PT Re-evaluation, 97110-Therapeutic exercises, 97530- Therapeutic activity, 97112- Neuromuscular re-education, 97535- Self Care, 02725- Manual therapy, 97014- Electrical stimulation (unattended), [id number]- Traction (mechanical), [id number]- Ionotophoresis 4mg /ml  Dexamethasone, Patient/Family education, Taping, Dry Needling, Joint mobilization, Cryotherapy, and Moist heat.  PLAN FOR NEXT SESSION: tennis ball hip mobilization/ foam roller if tolreable, squats progression to tolerance, review HEP and modify as needed. *consider DN lumbar paraspinal L side, traction due to disc protrusion.   Quamere Mussell, PT 01/19/2023, 1:12 PM

## 2023-01-22 ENCOUNTER — Encounter: Payer: Self-pay | Admitting: Emergency Medicine

## 2023-01-22 ENCOUNTER — Ambulatory Visit
Admission: EM | Admit: 2023-01-22 | Discharge: 2023-01-22 | Disposition: A | Discharge status: Home / Self Care | Payer: 59 | Attending: Family Medicine | Admitting: Family Medicine

## 2023-01-22 DIAGNOSIS — R42 Dizziness and giddiness: Secondary | ICD-10-CM | POA: Diagnosis not present

## 2023-01-22 MED ORDER — MECLIZINE HCL 12.5 MG PO TABS: 12.5000 mg | ORAL_TABLET | Freq: Three times a day (TID) | ORAL | 0 refills | Status: AC | PRN

## 2023-01-22 NOTE — ED Triage Notes (Signed)
Patient states that she's been extremely dizzy since 6:30am this morning.  Denies any nausea or vomiting.  No headaches.  No history of vertigo.

## 2023-01-22 NOTE — ED Provider Notes (Signed)
[name]    CSN: [id number] Arrival date & time: 01/22/23  1013      History   Chief Complaint Chief Complaint  Patient presents with   Dizziness    HPI Jo [name] is a 53 y.o. female.   Patient states that she woke up this morning and felt fine.  Shortly thereafter she felt off balance.  She is not actually spinning but she states with movement she feels like she might stumble.  Her daughters are here with her and states that she "walks like she was drunk".  Vision is not impaired.  No cognitive impairment.  No weakness in arms or legs.  No hypertension diabetes or hypercholesterol to put at risk for stroke.  No recent ear infection or sinus symptoms.  No recent head injury.  No history of vertigo.  Hearing is normal    Past Medical History:  Diagnosis Date   History of chicken pox    childhood   Urticaria    Uveitis     Patient Active Problem List   Diagnosis Date Noted   Chronic left-sided low back pain with left-sided sciatica 08/31/2022   Recurrent urticaria 03/08/2016   Angioedema 03/08/2016   Strep throat 03/27/2014   Blister of lip without infection 04/05/2012   Conjunctivitis 01/14/2012   Hyperglycemia 10/11/2010   Pure hypercholesterolemia 10/11/2010   Anemia 10/11/2010   Skin lesion 10/11/2010   ANEMIA-NOS 11/16/2007    Past Surgical History:  Procedure Laterality Date   CESAREAN SECTION  2009   COMBINED AUGMENTATION MAMMAPLASTY AND ABDOMINOPLASTY  2004    OB History   No obstetric history on file.      Home Medications    Prior to Admission medications   Medication Sig Start Date End Date Taking? Authorizing Provider  meclizine (ANTIVERT) 12.5 MG tablet Take 1 tablet (12.5 mg total) by mouth 3 (three) times daily as needed for dizziness. 01/22/23  Yes Eustace Moore, MD  LORazepam (ATIVAN) 1 MG tablet Take 1 tablet (1 mg total) by mouth every 8 (eight) hours. Patient not taking: Reported on 01/19/2023 09/20/22   Huel Cote, MD    Family History Family History  Problem Relation Age of Onset   Hyperlipidemia Mother    Hypertension Mother    Hyperlipidemia Father    Hypertension Father    Diabetes Paternal Grandmother     Social History Social History   Tobacco Use   Smoking status: Never   Smokeless tobacco: Never  Vaping Use   Vaping status: Never Used  Substance Use Topics   Alcohol use: Not Currently   Drug use: No     Allergies   No known allergies   Review of Systems Review of Systems See HPI  Physical Exam Triage Vital Signs ED Triage Vitals  Encounter Vitals Group     BP 01/22/23 1029 102/66     Systolic BP Percentile --      Diastolic BP Percentile --      Pulse Rate 01/22/23 1029 78     Resp 01/22/23 1029 18     Temp 01/22/23 1029 97.6 F (36.4 C)     Temp Source 01/22/23 1029 Oral     SpO2 01/22/23 1029 100 %     Weight 01/22/23 1031 140 lb (63.5 kg)     Height 01/22/23 1031 5\' 5"  (1.651 m)     Head Circumference --      Peak Flow --  Pain Score 01/22/23 1031 0     Pain Loc --      Pain Education --      Exclude from Growth Chart --    No data found.  Updated Vital Signs BP 102/66 (BP Location: Left Arm)   Pulse 78   Temp 97.6 F (36.4 C) (Oral)   Resp 18   Ht 5\' 5"  (1.651 m)   Wt 63.5 kg   SpO2 100%   BMI 23.30 kg/m      Physical Exam Constitutional:      General: She is not in acute distress.    Appearance: She is well-developed.  HENT:     Head: Normocephalic and atraumatic.     Right Ear: Tympanic membrane and ear canal normal.     Left Ear: Tympanic membrane and ear canal normal.     Nose: Nose normal. No congestion.     Mouth/Throat:     Mouth: Mucous membranes are moist.     Pharynx: No posterior oropharyngeal erythema.  Eyes:     Conjunctiva/sclera: Conjunctivae normal.     Pupils: Pupils are equal, round, and reactive to light.     Comments: Discs are flat.  Right nystagmus  Cardiovascular:     Rate and Rhythm: Normal  rate.  Pulmonary:     Effort: Pulmonary effort is normal. No respiratory distress.  Abdominal:     General: There is no distension.     Palpations: Abdomen is soft.  Musculoskeletal:        General: Normal range of motion.     Cervical back: Normal range of motion.  Skin:    General: Skin is warm and dry.  Neurological:     General: No focal deficit present.     Mental Status: She is alert.     Cranial Nerves: No cranial nerve deficit.     Sensory: No sensory deficit.     Motor: No weakness.     Coordination: Coordination normal.     Gait: Gait normal.     Deep Tendon Reflexes: Reflexes normal.  Psychiatric:        Mood and Affect: Mood normal.      UC Treatments / Results  Labs (all labs ordered are listed, but only abnormal results are displayed) Labs Reviewed - No data to display  EKG   Radiology No results found.  Procedures Procedures (including critical care time)  Medications Ordered in UC Medications - No data to display  Initial Impression / Assessment and Plan / UC Course  I have reviewed the triage vital signs and the nursing notes.  Pertinent labs & imaging results that were available during my care of the patient were reviewed by me and considered in my medical decision making (see chart for details).     Discussed vertigo.  Treatment.  She is in physical therapy for her back and might mention need for Epley maneuver Final Clinical Impressions(s) / UC Diagnoses   Final diagnoses:  Vertigo     Discharge Instructions      DO NOT DRIVE, OPERATE MACHINERY OR CLIMB LADDERS until the vertigo improves Drink more water Take the Antivert 1 or 2 pills 3 times a day See your doctor if not improving by next week   ED Prescriptions     Medication Sig Dispense Auth. Provider   meclizine (ANTIVERT) 12.5 MG tablet Take 1 tablet (12.5 mg total) by mouth 3 (three) times daily as needed for dizziness. 30 tablet Rica Mast  Fannie Knee, MD      PDMP not  reviewed this encounter.   Eustace Moore, MD 01/22/23 [phone]

## 2023-01-22 NOTE — Discharge Instructions (Addendum)
DO NOT DRIVE, OPERATE MACHINERY OR CLIMB LADDERS until the vertigo improves Drink more water Take the Antivert 1 or 2 pills 3 times a day See your doctor if not improving by next week

## 2023-01-26 ENCOUNTER — Encounter: Payer: Self-pay | Admitting: Rehabilitative and Restorative Service Providers"

## 2023-01-26 ENCOUNTER — Ambulatory Visit: Payer: 59 | Admitting: Rehabilitative and Restorative Service Providers"

## 2023-01-26 DIAGNOSIS — M5459 Other low back pain: Secondary | ICD-10-CM | POA: Diagnosis not present

## 2023-01-26 DIAGNOSIS — G8929 Other chronic pain: Secondary | ICD-10-CM | POA: Diagnosis not present

## 2023-01-26 DIAGNOSIS — M6281 Muscle weakness (generalized): Secondary | ICD-10-CM

## 2023-01-26 DIAGNOSIS — M5442 Lumbago with sciatica, left side: Secondary | ICD-10-CM | POA: Diagnosis not present

## 2023-01-26 NOTE — Therapy (Signed)
OUTPATIENT PHYSICAL THERAPY THORACOLUMBAR TREATMENT   Patient Name: Jo [name] MRN: [id number] DOB:1970-03-27, 53 y.o., female Today's Date: 01/26/2023  END OF SESSION:  PT End of Session - 01/26/23 1406     Visit Number 2    Number of Visits 16    Date for PT Re-Evaluation 03/20/23    Authorization Type aetna- 30 visits/year    PT Start Time 1404    PT Stop Time 1445    PT Time Calculation (min) 41 min    Activity Tolerance Patient tolerated treatment well    Behavior During Therapy [facility] for tasks assessed/performed            Past Medical History:  Diagnosis Date   History of chicken pox    childhood   Urticaria    Uveitis    Past Surgical History:  Procedure Laterality Date   CESAREAN SECTION  2009   COMBINED AUGMENTATION MAMMAPLASTY AND ABDOMINOPLASTY  2004   Patient Active Problem List   Diagnosis Date Noted   Chronic left-sided low back pain with left-sided sciatica 08/31/2022   Recurrent urticaria 03/08/2016   Angioedema 03/08/2016   Strep throat 03/27/2014   Blister of lip without infection 04/05/2012   Conjunctivitis 01/14/2012   Hyperglycemia 10/11/2010   Pure hypercholesterolemia 10/11/2010   Anemia 10/11/2010   Skin lesion 10/11/2010   ANEMIA-NOS 11/16/2007    PCP: Dorena Bodo, NP REFERRING PROVIDER: Huel Cote, MD REFERRING DIAG:    [phone] (ICD-10-CM) - Chronic left-sided low back pain with left-sided sciatica    Rationale for Evaluation and Treatment: Rehabilitation  THERAPY DIAG:  Muscle weakness (generalized)  Other low back pain  ONSET DATE: 01/10/2023  SUBJECTIVE:                                                                                                                                                                                           SUBJECTIVE STATEMENT: The patient was seen at urgent care due to vestibular symptoms last week. She reports it was a sudden onset and has cleared at this time. The child's  pose with lateral lean is worse.   EVAL: The patient reports she had onset of L gluteal pain in 2020, but has always been able to get it to go away. It began the past summer and has remained constant. She describes pain in the sacral region that radiates into the left leg. The pain stops behind the knee, however, she does get some pain in her left foot. She uses ice and heat to manage symptoms.  She is active at work standing x hours. She also has horses and lifts heavy  items around the home/land (does get pain later in the day after lifting).   PERTINENT HISTORY:  N/a  PAIN:  Are you having pain? Yes: NPRS scale: 7/10 at night, varies during the day  Pain location: L gluteal region into posterior leg to knee Pain description: can be shooting/sharp intermittently, consistent dull pain Aggravating factors: initially worse with standing, now prolonged sitting can irritate pain  Relieving factors: takes 2 tylenol and 1 advil every morning   PRECAUTIONS: None  WEIGHT BEARING RESTRICTIONS: No  FALLS:  Has patient fallen in last 6 months? No  LIVING ENVIRONMENT: Lives with: lives with their family Lives in: House/apartment  PATIENT GOALS: reduce pain  OBJECTIVE:  Note: Objective measures were completed at Evaluation unless otherwise noted.  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 MRI IMPRESSION: 1. L4-L5 left foraminal disc extrusion that contacts and displaces the emerging L4 root and results in moderate to severe foraminal stenosis. 2. L3-L4 left foraminal disc protrusion resulting in mild foraminal stenosis. 3. Multilevel spondylosis in the remaining levels as described above without high-grade canal or foraminal stenosis.   PATIENT SURVEYS:  FOTO 94%-- patient does not limit activities -- she does them with pain  SENSATION: WFL  MUSCLE LENGTH: Hamstrings: Right 0 deg; Left -20  deg from full extension  L tightness in  quad  POSTURE: No Significant postural limitations  PALPATION: Tender L4-L5 paraspinal musculature Hypomobile PA lumbar spine  LUMBAR ROM:  AROM eval  Flexion WNLs  Extension painful  Right lateral flexion WNLs  Left lateral flexion WNLs  Right rotation WNLs  Left rotation WNLs   (Blank rows = not tested)  LOWER EXTREMITY ROM:   WFLs  LOWER EXTREMITY MMT:   MMT Right eval Left eval  Hip flexion 4/5 3+/5  Hip extension 4/5 3+/5  Hip abduction 5/5 4/5  Hip adduction    Hip internal rotation    Hip external rotation    Knee flexion 5/5 5/5  Knee extension 5/5 5/5  Ankle dorsiflexion 4/5 3+/5  Ankle plantarflexion 4/5 4/5  Ankle inversion    Ankle eversion     (Blank rows = not tested)  LUMBAR SPECIAL TESTS:  Slump test: Negative and Single leg stance test: >20 seconds bilaterally 01/26/23: + figure 4 test  [facility] Adult PT Treatment:                                                DATE: 01/26/2023 Therapeutic Exercise: Supine Figure 4 stretch Bridges adding green theraband abduction to reduce lateral pain Hip flexion isometrics x 5 seconds x 5 reps Prone Hip IR/ER with passive overpressure Contract/relax and isometrics hip IR/ER Stand Hip hinge with dowel with tactile cues x 10 reps Squats with kettle bell 10# x 10 reps Squats with 10# weights (5 in each hand) x 10 reps Manual Therapy: STM L gluts with passive IR/ER hip in prone and deep pressure in gluts Self Care: Tennis ball rollout for STM against wall   [facility] Adult PT Treatment:                                                DATE: 01/19/23 Therapeutic Exercise: See HEP below  PATIENT EDUCATION:  Education details: HEP, plan of care, discussed dry needling and traction as potential interventions if needed Person educated: Patient Education method: Explanation, Demonstration, and Handouts Education comprehension: verbalized understanding, returned demonstration, and needs further education  HOME  EXERCISE PROGRAM: Access Code: [email or web address] URL: [email or web address] Date: 01/26/2023 Prepared by: Margretta Ditty  Exercises - Cat Cow  - 1 x daily - 7 x weekly - 1 sets - 10 reps - Primal Push Up  - 1 x daily - 7 x weekly - 1 sets - 8-10 reps - 5-10 seconds hold - Prone Press Up  - 2 x daily - 7 x weekly - 1 sets - 10 reps - Seated Hamstring Stretch with Chair  - 1 x daily - 7 x weekly - 1 sets - 3 reps - 30 seconds hold - Seated Hip External Rotation Stretch  - 2 x daily - 7 x weekly - 1 sets - 10 reps - Standing Hip Hinge with Dowel  - 1 x daily - 1 x weekly - 1 sets - 10 reps - Glute Max Release With Campbell Soup Against Wall  - 2 x daily - 7 x weekly - 1 sets - 10 reps  ASSESSMENT:  CLINICAL IMPRESSION: The patient is able to complete lumbar extension today. She tolerates prone press ups. She continues with 4/10 pain lateral hip and point tenderness along L lateral sacral border. She did not have point tenderness over L4-L5 paraspinal musculature.  PT is progressing therapeutic exercise, working on squat mechanics for chores, and soft tissue release. Per imaging, patient does have disc protrusion-- can try traction if there ex is not reducing lateral hip pain.   EVAL: Patient is a 53 y.o. female who was seen today for physical therapy evaluation and treatment for L low back pain with sciatica. She presents with pain that is constant in nature, that increases with heavy lifting, and being in a prolonged position (sitting or standing). She has LE weakness greater on the L side as compared to the R side, hypomobility lumbar spine, shortened L hamstring musculature.   GOALS: Goals reviewed with patient? Yes  SHORT TERM GOALS: Target date:02/19/23  The patient will be indep with HEP. Baseline: initiated at eval. Goal status: INITIAL  2.  The patient will report pain in the evening < or equal to 3/10. Baseline:  7/10 in evenings Goal status: INITIAL  3.  The patient  will improve L HS length to -10 degrees from full extension (supine 90/90 position). Baseline:  -20 Goal status: INITIAL   LONG TERM GOALS: Target date: 03/20/23  The patient will be indep with HEP progression. Baseline:  initiated at eval. Goal status: INITIAL  2.  The patient will maintain FOTO score of 94%. Baseline:  not limited in activities, but does have pain with chores. Goal status: INITIAL  3.  The patient will improve hip flexion strength to 5/5 bilaterally. Baseline:  see MMT chart Goal status: INITIAL  4.  The patient will improve bilateral ankle DF to 5/5. Baseline: see MMT chart Goal status: INITIAL  5.  The patient will report resolution of pain in L thigh/gluteal and sacral region. Baseline:  varies Goal status: INITIAL  PLAN:  PT FREQUENCY: 2x/week  PT DURATION: 8 weeks  PLANNED INTERVENTIONS: 97164- PT Re-evaluation, 97110-Therapeutic exercises, 97530- Therapeutic activity, [id number]- Neuromuscular re-education, 97535- Self Care, 16109- Manual therapy, 97014- Electrical stimulation (unattended), [id number]- Traction (mechanical), [id number]- Ionotophoresis 4mg /ml Dexamethasone, Patient/Family education, Taping, Dry Needling, Joint mobilization, Cryotherapy, and  Moist heat.  PLAN FOR NEXT SESSION: squat progression to tolerance, review HEP and modify as needed. *consider DN lumbar paraspinal L side, traction due to disc protrusion; hip strengthening, plank progression.   Raysha Tilmon, PT 01/26/2023, 2:07 PM

## 2023-01-31 ENCOUNTER — Encounter: Payer: Self-pay | Admitting: Rehabilitative and Restorative Service Providers"

## 2023-01-31 ENCOUNTER — Ambulatory Visit: Payer: 59 | Admitting: Rehabilitative and Restorative Service Providers"

## 2023-01-31 DIAGNOSIS — M5459 Other low back pain: Secondary | ICD-10-CM | POA: Diagnosis not present

## 2023-01-31 DIAGNOSIS — M5442 Lumbago with sciatica, left side: Secondary | ICD-10-CM | POA: Diagnosis not present

## 2023-01-31 DIAGNOSIS — G8929 Other chronic pain: Secondary | ICD-10-CM | POA: Diagnosis not present

## 2023-01-31 DIAGNOSIS — M6281 Muscle weakness (generalized): Secondary | ICD-10-CM | POA: Diagnosis not present

## 2023-01-31 NOTE — Therapy (Signed)
OUTPATIENT PHYSICAL THERAPY THORACOLUMBAR TREATMENT   Patient Name: Jo [name] MRN: [id number] DOB:[birth year], 53 y.o., female Today's Date: 01/31/2023  END OF SESSION:  PT End of Session - 01/31/23 1155     Visit Number 3    Number of Visits 16    Date for PT Re-Evaluation 03/20/23    Authorization Type aetna- 30 visits/year    PT Start Time 1155    PT Stop Time 1235    PT Time Calculation (min) 40 min    Activity Tolerance Patient tolerated treatment well    Behavior During Therapy [facility] for tasks assessed/performed             Past Medical History:  Diagnosis Date   History of chicken pox    childhood   Urticaria    Uveitis    Past Surgical History:  Procedure Laterality Date   CESAREAN SECTION  2009   COMBINED AUGMENTATION MAMMAPLASTY AND ABDOMINOPLASTY  2004   Patient Active Problem List   Diagnosis Date Noted   Chronic left-sided low back pain with left-sided sciatica 08/31/2022   Recurrent urticaria 03/08/2016   Angioedema 03/08/2016   Strep throat 03/27/2014   Blister of lip without infection 04/05/2012   Conjunctivitis 01/14/2012   Hyperglycemia 10/11/2010   Pure hypercholesterolemia 10/11/2010   Anemia 10/11/2010   Skin lesion 10/11/2010   ANEMIA-NOS 11/16/2007    PCP: Dorena Bodo, NP REFERRING PROVIDER: Huel Cote, MD REFERRING DIAG:    [phone] (ICD-10-CM) - Chronic left-sided low back pain with left-sided sciatica    Rationale for Evaluation and Treatment: Rehabilitation  THERAPY DIAG:  Muscle weakness (generalized)  Other low back pain  ONSET DATE: 01/10/2023  SUBJECTIVE:                                                                                                                                                                                           SUBJECTIVE STATEMENT: The patient reports her low back was sore Saturday after loading and unloading horsefeed. 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  The patient is improving since last session. The hardest exercise is the hamstring stretch--bringing R leg up increases L hip pain.  EVAL: The patient reports she had onset of L gluteal pain in 2020, but has always been able to get it to go away. It began the past summer and has remained constant. She describes pain in the sacral region that radiates into the left leg. The pain stops behind the knee, however, she does get some pain in her left foot. She uses ice and heat to manage symptoms.  She is active at  work standing x hours. She also has horses and lifts heavy items around the home/land (does get pain later in the day after lifting).   PERTINENT HISTORY:  N/a  PAIN:  Are you having pain? Yes: NPRS scale: "not much" today Pain location: L gluteal region into posterior leg to knee Pain description: can be shooting/sharp intermittently, consistent dull pain Aggravating factors: initially worse with standing, now prolonged sitting can irritate pain  Relieving factors: takes 2 tylenol and 1 advil every morning   PRECAUTIONS: None  WEIGHT BEARING RESTRICTIONS: No  FALLS:  Has patient fallen in last 6 months? No  LIVING ENVIRONMENT: Lives with: lives with their family Lives in: House/apartment  PATIENT GOALS: reduce pain  OBJECTIVE:  Note: Objective measures were completed at Evaluation unless otherwise noted.  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 MRI IMPRESSION: 1. L4-L5 left foraminal disc extrusion that contacts and displaces the emerging L4 root and results in moderate to severe foraminal stenosis. 2. L3-L4 left foraminal disc protrusion resulting in mild foraminal stenosis. 3. Multilevel spondylosis in the remaining levels as described above without high-grade canal or foraminal stenosis.   PATIENT SURVEYS:  FOTO 94%-- patient does not limit activities -- she does them with pain  SENSATION: WFL  MUSCLE LENGTH: Hamstrings:  Right 0 deg; Left -20  deg from full extension  L tightness in quad  POSTURE: No Significant postural limitations  PALPATION: Tender L4-L5 paraspinal musculature Hypomobile PA lumbar spine  LUMBAR ROM:  AROM eval  Flexion WNLs  Extension painful  Right lateral flexion WNLs  Left lateral flexion WNLs  Right rotation WNLs  Left rotation WNLs   (Blank rows = not tested)  LOWER EXTREMITY ROM:   WFLs  LOWER EXTREMITY MMT:   MMT Right eval Left eval  Hip flexion 4/5 3+/5  Hip extension 4/5 3+/5  Hip abduction 5/5 4/5  Hip adduction    Hip internal rotation    Hip external rotation    Knee flexion 5/5 5/5  Knee extension 5/5 5/5  Ankle dorsiflexion 4/5 3+/5  Ankle plantarflexion 4/5 4/5  Ankle inversion    Ankle eversion     (Blank rows = not tested)  LUMBAR SPECIAL TESTS:  Slump test: Negative and Single leg stance test: >20 seconds bilaterally 01/26/23: + figure 4 test    [facility] Adult PT Treatment:                                                DATE: 01/31/23 Therapeutic Exercise: Standing  Squat to overhead press 10# x 10 reps Squat x 12 reps wit hcues on hip hinging Split squat x 10 reps R and L sides 10# Split squat rotation R and L x 12 reps Single leg dead lift x 10 reps working on single leg stance in between Seated Piriformis stretch Pigeon stretch with yoga block Supine Figure 4 stretch with lateral hip discomfort Bridging with coregous ball Bridging with red band with hip abduction Piriformis stretch L side-- some discomfort Manual Therapy: Overpressure into hip ER and IR with isometrics for contract/relax STM lateral border of sacrum  [facility] Adult PT Treatment:  DATE: 01/26/2023 Therapeutic Exercise: Supine Figure 4 stretch Bridges adding green theraband abduction to reduce lateral pain Hip flexion isometrics x 5 seconds x 5 reps Prone Hip IR/ER with passive overpressure Contract/relax and isometrics  hip IR/ER Stand Hip hinge with dowel with tactile cues x 10 reps Squats with kettle bell 10# x 10 reps Squats with 10# weights (5 in each hand) x 10 reps Manual Therapy: STM L gluts with passive IR/ER hip in prone and deep pressure in gluts Self Care: Tennis ball rollout for STM against wall   [facility] Adult PT Treatment:                                                DATE: 01/19/23 Therapeutic Exercise: See HEP below   PATIENT EDUCATION:  Education details: HEP, plan of care, discussed dry needling and traction as potential interventions if needed Person educated: Patient Education method: Explanation, Demonstration, and Handouts Education comprehension: verbalized understanding, returned demonstration, and needs further education  HOME EXERCISE PROGRAM: Access Code: [email or web address] URL: [email or web address] Date: 01/26/2023 Prepared by: Margretta Ditty  Exercises - Cat Cow  - 1 x daily - 7 x weekly - 1 sets - 10 reps - Primal Push Up  - 1 x daily - 7 x weekly - 1 sets - 8-10 reps - 5-10 seconds hold - Prone Press Up  - 2 x daily - 7 x weekly - 1 sets - 10 reps - Seated Hamstring Stretch with Chair  - 1 x daily - 7 x weekly - 1 sets - 3 reps - 30 seconds hold - Seated Hip External Rotation Stretch  - 2 x daily - 7 x weekly - 1 sets - 10 reps - Standing Hip Hinge with Dowel  - 1 x daily - 1 x weekly - 1 sets - 10 reps - Glute Max Release With Campbell Soup Against Wall  - 2 x daily - 7 x weekly - 1 sets - 10 reps  ASSESSMENT:  CLINICAL IMPRESSION: The patient is making progress with greater tolerance to activities on Sunday at home. She demonstrates improved technique with squats today. PT continuing to progress through strengthening. Patient to get heavier weights for home to continue to progress strengthening. PT is progressing therapeutic exercise, working on squat mechanics for chores, and soft tissue release. Per imaging, patient does have disc protrusion-- can try  traction if there ex is not reducing lateral hip pain.   EVAL: Patient is a 53 y.o. female who was seen today for physical therapy evaluation and treatment for L low back pain with sciatica. She presents with pain that is constant in nature, that increases with heavy lifting, and being in a prolonged position (sitting or standing). She has LE weakness greater on the L side as compared to the R side, hypomobility lumbar spine, shortened L hamstring musculature.   GOALS: Goals reviewed with patient? Yes  SHORT TERM GOALS: Target date:02/19/23  The patient will be indep with HEP. Baseline: initiated at eval. Goal status: INITIAL  2.  The patient will report pain in the evening < or equal to 3/10. Baseline:  7/10 in evenings Goal status: INITIAL  3.  The patient will improve L HS length to -10 degrees from full extension (supine 90/90 position). Baseline:  -20 Goal status: INITIAL   LONG TERM GOALS: Target date: 03/20/23  The patient  will be indep with HEP progression. Baseline:  initiated at eval. Goal status: INITIAL  2.  The patient will maintain FOTO score of 94%. Baseline:  not limited in activities, but does have pain with chores. Goal status: INITIAL  3.  The patient will improve hip flexion strength to 5/5 bilaterally. Baseline:  see MMT chart Goal status: INITIAL  4.  The patient will improve bilateral ankle DF to 5/5. Baseline: see MMT chart Goal status: INITIAL  5.  The patient will report resolution of pain in L thigh/gluteal and sacral region. Baseline:  varies Goal status: INITIAL  PLAN:  PT FREQUENCY: 2x/week  PT DURATION: 8 weeks  PLANNED INTERVENTIONS: 97164- PT Re-evaluation, 97110-Therapeutic exercises, 97530- Therapeutic activity, 97112- Neuromuscular re-education, 97535- Self Care, 16109- Manual therapy, 97014- Electrical stimulation (unattended), [id number]- Traction (mechanical), [id number]- Ionotophoresis 4mg /ml Dexamethasone, Patient/Family education,  Taping, Dry Needling, Joint mobilization, Cryotherapy, and Moist heat.  PLAN FOR NEXT SESSION: squat progression to tolerance, review HEP and modify as needed. *consider DN lumbar paraspinal L side, traction due to disc protrusion; hip strengthening, plank progression.   Othelia Riederer, PT 01/31/2023, 11:55 AM

## 2023-02-09 ENCOUNTER — Encounter: Payer: Self-pay | Admitting: Rehabilitative and Restorative Service Providers"

## 2023-02-09 ENCOUNTER — Ambulatory Visit: Payer: 59 | Attending: Orthopaedic Surgery | Admitting: Rehabilitative and Restorative Service Providers"

## 2023-02-09 DIAGNOSIS — M6281 Muscle weakness (generalized): Secondary | ICD-10-CM | POA: Diagnosis not present

## 2023-02-09 DIAGNOSIS — M5459 Other low back pain: Secondary | ICD-10-CM | POA: Diagnosis not present

## 2023-02-09 NOTE — Therapy (Addendum)
 OUTPATIENT PHYSICAL THERAPY THORACOLUMBAR TREATMENT AND DISCHARGE SUMMARY   Patient Name: LAKECIA DESCHAMPS MRN: [id number] DOB:June 16, 1970, 53 y.o., female Today's Date: 02/09/2023   Patient did not return to PT.  Please refer to note below for patient status. Thank you for the referral of this patient. Tawni Ferrier, MPT  END OF SESSION:  PT End of Session - 02/09/23 1152     Visit Number 4    Number of Visits 16    Date for PT Re-Evaluation 03/20/23    Authorization Type aetna- 30 visits/year    PT Start Time 1152    PT Stop Time 1230    PT Time Calculation (min) 38 min    Activity Tolerance Patient tolerated treatment well    Behavior During Therapy WFL for tasks assessed/performed             Past Medical History:  Diagnosis Date   History of chicken pox    childhood   Urticaria    Uveitis    Past Surgical History:  Procedure Laterality Date   CESAREAN SECTION  2009   COMBINED AUGMENTATION MAMMAPLASTY AND ABDOMINOPLASTY  2004   Patient Active Problem List   Diagnosis Date Noted   Chronic left-sided low back pain with left-sided sciatica 08/31/2022   Recurrent urticaria 03/08/2016   Angioedema 03/08/2016   Strep throat 03/27/2014   Blister of lip without infection 04/05/2012   Conjunctivitis 01/14/2012   Hyperglycemia 10/11/2010   Pure hypercholesterolemia 10/11/2010   Anemia 10/11/2010   Skin lesion 10/11/2010   ANEMIA-NOS 11/16/2007    PCP: Darice Brownie, NP REFERRING PROVIDER: Genelle Standing, MD REFERRING DIAG:    [phone] (ICD-10-CM) - Chronic left-sided low back pain with left-sided sciatica    Rationale for Evaluation and Treatment: Rehabilitation  THERAPY DIAG:  Muscle weakness (generalized)  Other low back pain  ONSET DATE: 01/10/2023  SUBJECTIVE:                                                                                                                                                                                            SUBJECTIVE STATEMENT: The patient reports pain is significantly reduced. It's no longer pain but discomfort. The patient is tolerating exercises well.   EVAL: The patient reports she had onset of L gluteal pain in 2020, but has always been able to get it to go away. It began the past summer and has remained constant. She describes pain in the sacral region that radiates into the left leg. The pain stops behind the knee, however, she does get some pain in her left foot. She uses ice and heat to manage  symptoms.  She is active at work standing x hours. She also has horses and lifts heavy items around the home/land (does get pain later in the day after lifting).   PERTINENT HISTORY:  N/a  PAIN:  Are you having pain? Yes: NPRS scale: not much today Pain location: L gluteal region into posterior leg to knee Pain description: can be shooting/sharp intermittently, consistent dull pain Aggravating factors: initially worse with standing, now prolonged sitting can irritate pain  Relieving factors: takes 2 tylenol and 1 advil every morning   PRECAUTIONS: None  WEIGHT BEARING RESTRICTIONS: No  FALLS:  Has patient fallen in last 6 months? No  LIVING ENVIRONMENT: Lives with: lives with their family Lives in: House/apartment  PATIENT GOALS: reduce pain  OBJECTIVE:  Note: Objective measures were completed at Evaluation unless otherwise noted.  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 MRI IMPRESSION: 1. L4-L5 left foraminal disc extrusion that contacts and displaces the emerging L4 root and results in moderate to severe foraminal stenosis. 2. L3-L4 left foraminal disc protrusion resulting in mild foraminal stenosis. 3. Multilevel spondylosis in the remaining levels as described above without high-grade canal or foraminal stenosis.   PATIENT SURVEYS:  FOTO 94%-- patient does not limit activities -- she does them with  pain  SENSATION: WFL  MUSCLE LENGTH: Hamstrings: Right 0 deg; Left -20  deg from full extension  L tightness in quad  POSTURE: No Significant postural limitations  PALPATION: Tender L4-L5 paraspinal musculature Hypomobile PA lumbar spine  LUMBAR ROM:  AROM eval  Flexion WNLs  Extension painful  Right lateral flexion WNLs  Left lateral flexion WNLs  Right rotation WNLs  Left rotation WNLs   (Blank rows = not tested)  LOWER EXTREMITY ROM:   WFLs  LOWER EXTREMITY MMT:   MMT Right eval Left eval Left 02/09/23  Hip flexion 4/5 3+/5 4/5  Hip extension 4/5 3+/5   Hip abduction 5/5 4/5   Hip adduction     Hip internal rotation     Hip external rotation     Knee flexion 5/5 5/5   Knee extension 5/5 5/5   Ankle dorsiflexion 4/5 3+/5 3+/5  Ankle plantarflexion 4/5 4/5   Ankle inversion     Ankle eversion      (Blank rows = not tested)  LUMBAR SPECIAL TESTS:  Slump test: Negative and Single leg stance test: >20 seconds bilaterally 01/26/23: + figure 4 test   [facility] Adult PT Treatment:                                                DATE: 02/09/2023 Therapeutic Exercise: -6 degrees from extension knee flexion Standing Hip abduction red band x 10 reps Hip extension red band x 10 reps Hip flexion red band x 10 reps Squats reviewed Seated Ankle dorsiflexion x blue band x 10 reps MMT Prone press up with one leg on mat and one off x 3 reps x 30 second holds  [facility] Adult PT Treatment:                                                DATE: 01/31/23 Therapeutic Exercise: Standing  Squat to overhead press 10# x 10  reps Squat x 12 reps wit hcues on hip hinging Split squat x 10 reps R and L sides 10# Split squat rotation R and L x 12 reps Single leg dead lift x 10 reps working on single leg stance in between Seated Piriformis stretch Pigeon stretch with yoga block Supine Figure 4 stretch with lateral hip discomfort Bridging with coregous ball Bridging with red band with hip  abduction Piriformis stretch L side-- some discomfort Manual Therapy: Overpressure into hip ER and IR with isometrics for contract/relax STM lateral border of sacrum  [facility] Adult PT Treatment:                                                DATE: 01/26/2023 Therapeutic Exercise: Supine Figure 4 stretch Bridges adding green theraband abduction to reduce lateral pain Hip flexion isometrics x 5 seconds x 5 reps Prone Hip IR/ER with passive overpressure Contract/relax and isometrics hip IR/ER Stand Hip hinge with dowel with tactile cues x 10 reps Squats with kettle bell 10# x 10 reps Squats with 10# weights (5 in each hand) x 10 reps Manual Therapy: STM L gluts with passive IR/ER hip in prone and deep pressure in gluts Self Care: Tennis ball rollout for STM against wall   [facility] Adult PT Treatment:                                                DATE: 01/19/23 Therapeutic Exercise: See HEP below   PATIENT EDUCATION:  Education details: HEP, plan of care, discussed dry needling and traction as potential interventions if needed Person educated: Patient Education method: Explanation, Demonstration, and Handouts Education comprehension: verbalized understanding, returned demonstration, and needs further education  HOME EXERCISE PROGRAM: Access Code: [email or web address] URL: [email or web address] Date: 02/09/2023 Prepared by: Tawni Ferrier  Exercises - Cat Cow  - 1 x daily - 3 x weekly - 1 sets - 10 reps - Prone Press Up  - 1 x daily - 3 x weekly - 1 sets - 10 reps - Seated Hamstring Stretch with Chair  - 1 x daily - 3 x weekly - 1 sets - 3 reps - 30 seconds hold - Seated Hip External Rotation Stretch  - 1 x daily - 3 x weekly - 1 sets - 10 reps - Dumbbell Squat at Shoulders  - 1 x daily - 3 x weekly - 1 sets - 10 reps - Single-Leg [place] Deadlift With Dumbbell  - 1 x daily - 3 x weekly - 2 sets - 5 reps - Seated Hip External Rotation with Resistance  - 1 x daily - 3 x weekly  - 1 sets - 10 reps - Seated Hip Internal Rotation with Anchored Resistance  - 1 x daily - 3 x weekly - 1 sets - 10 reps - Hip Abduction with Resistance Loop  - 1 x daily - 3 x weekly - 1 sets - 10 reps - Hip Extension with Resistance Loop  - 1 x daily - 3 x weekly - 1 sets - 10 reps - Standing Hip Flexion with Resistance Loop  - 1 x daily - 3 x weekly - 1 sets - 10 reps - Seated Ankle Dorsiflexion with Resistance  - 1 x daily -  3 x weekly - 1 sets - 10 reps - Prone SIJ Anterior Rotation Mobilization on Table  - 1 x daily - 3 x weekly - 1 sets - 2 reps - 30 seconds hold - Glute Max Release With Campbell [employer] Against Wall  - 1 x daily - 3 x weekly - 1 sets - 10 reps  ASSESSMENT:  CLINICAL IMPRESSION: The patient is continuing to make progress now meeting 3/3 STGs. She does continue with weakness in L hip flexors and L anterior tibialis. PT focused interventions at hip strength and ankle strength and divided HEP into 2 groups to alternate days. Pain is improving and patient is tolerating movement well. We will continue to progress strengthening.   EVAL: Patient is a 53 y.o. female who was seen today for physical therapy evaluation and treatment for L low back pain with sciatica. She presents with pain that is constant in nature, that increases with heavy lifting, and being in a prolonged position (sitting or standing). She has LE weakness greater on the L side as compared to the R side, hypomobility lumbar spine, shortened L hamstring musculature.   GOALS: Goals reviewed with patient? Yes  SHORT TERM GOALS: Target date:02/19/23  The patient will be indep with HEP. Baseline: initiated at eval. Goal status:  MET   2.  The patient will report pain in the evening < or equal to 3/10. Baseline:  7/10 in evenings Goal status: MET. Patient is 0/10 currently and experiences 'discomfort at times in left anterior hip.   3.  The patient will improve L HS length to -10 degrees from full extension  (supine 90/90 position). Baseline:  -20 Goal status: MET -6 degrees   LONG TERM GOALS: Target date: 03/20/23  The patient will be indep with HEP progression. Baseline:  initiated at eval. Goal status: INITIAL  2.  The patient will maintain FOTO score of 94%. Baseline:  not limited in activities, but does have pain with chores. Goal status: INITIAL  3.  The patient will improve hip flexion strength to 5/5 bilaterally. Baseline:  see MMT chart Goal status: INITIAL  4.  The patient will improve bilateral ankle DF to 5/5. Baseline: see MMT chart Goal status: INITIAL  5.  The patient will report resolution of pain in L thigh/gluteal and sacral region. Baseline:  varies Goal status: INITIAL  PLAN:  PT FREQUENCY: 2x/week  PT DURATION: 8 weeks  PLANNED INTERVENTIONS: 97164- PT Re-evaluation, 97110-Therapeutic exercises, 97530- Therapeutic activity, 97112- Neuromuscular re-education, 97535- Self Care, 02859- Manual therapy, 97014- Electrical stimulation (unattended), [id number]- Traction (mechanical), [id number]- Ionotophoresis 4mg /ml Dexamethasone, Patient/Family education, Taping, Dry Needling, Joint mobilization, Cryotherapy, and Moist heat.  PLAN FOR NEXT SESSION: L ankle, hip strengthening, Plank progression, core engagement, squat activities for functional strengthening.   Gloriajean Okun, PT 02/09/2023, 1:29 PM

## 2023-02-15 ENCOUNTER — Ambulatory Visit: Payer: 59 | Admitting: Physical Therapy

## 2023-02-15 NOTE — Therapy (Deleted)
 OUTPATIENT PHYSICAL THERAPY THORACOLUMBAR TREATMENT   Patient Name: Jo [name] MRN: [id number] DOB:08-Sep-1970, 53 y.o., female Today's Date: 02/15/2023  END OF SESSION:    Past Medical History:  Diagnosis Date   History of chicken pox    childhood   Urticaria    Uveitis    Past Surgical History:  Procedure Laterality Date   CESAREAN SECTION  2009   COMBINED AUGMENTATION MAMMAPLASTY AND ABDOMINOPLASTY  2004   Patient Active Problem List   Diagnosis Date Noted   Chronic left-sided low back pain with left-sided sciatica 08/31/2022   Recurrent urticaria 03/08/2016   Angioedema 03/08/2016   Strep throat 03/27/2014   Blister of lip without infection 04/05/2012   Conjunctivitis 01/14/2012   Hyperglycemia 10/11/2010   Pure hypercholesterolemia 10/11/2010   Anemia 10/11/2010   Skin lesion 10/11/2010   ANEMIA-NOS 11/16/2007    PCP: Dorena Bodo, NP REFERRING PROVIDER: Huel Cote, MD REFERRING DIAG:    [phone] (ICD-10-CM) - Chronic left-sided low back pain with left-sided sciatica    Rationale for Evaluation and Treatment: Rehabilitation  THERAPY DIAG:  No diagnosis found.  ONSET DATE: 01/10/2023  SUBJECTIVE:                                                                                                                                                                                           SUBJECTIVE STATEMENT: *** The patient reports pain is significantly reduced. It's no longer "pain" but "discomfort". The patient is tolerating exercises well.   EVAL: The patient reports she had onset of L gluteal pain in 2020, but has always been able to get it to go away. It began the past summer and has remained constant. She describes pain in the sacral region that radiates into the left leg. The pain stops behind the knee, however, she does get some pain in her left foot. She uses ice and heat to manage symptoms.  She is active at work standing x hours. She also has  horses and lifts heavy items around the home/land (does get pain later in the day after lifting).   PERTINENT HISTORY:  N/a  PAIN:  Are you having pain? Yes: NPRS scale: "not much" today Pain location: L gluteal region into posterior leg to knee Pain description: can be shooting/sharp intermittently, consistent dull pain Aggravating factors: initially worse with standing, now prolonged sitting can irritate pain  Relieving factors: takes 2 tylenol and 1 advil every morning   PRECAUTIONS: None  WEIGHT BEARING RESTRICTIONS: No  FALLS:  Has patient fallen in last 6 months? No  LIVING ENVIRONMENT: Lives with: lives with their family Lives in: House/apartment  PATIENT GOALS: reduce pain  OBJECTIVE:  Note: Objective measures were completed at Evaluation unless otherwise noted.  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 MRI IMPRESSION: 1. L4-L5 left foraminal disc extrusion that contacts and displaces the emerging L4 root and results in moderate to severe foraminal stenosis. 2. L3-L4 left foraminal disc protrusion resulting in mild foraminal stenosis. 3. Multilevel spondylosis in the remaining levels as described above without high-grade canal or foraminal stenosis.   PATIENT SURVEYS:  FOTO 94%-- patient does not limit activities -- she does them with pain  SENSATION: WFL  MUSCLE LENGTH: Hamstrings: Right 0 deg; Left -20  deg from full extension  L tightness in quad  PALPATION: Tender L4-L5 paraspinal musculature Hypomobile PA lumbar spine  LUMBAR ROM:  AROM eval  Flexion WNLs  Extension painful  Right lateral flexion WNLs  Left lateral flexion WNLs  Right rotation WNLs  Left rotation WNLs   (Blank rows = not tested)  LOWER EXTREMITY ROM:   WFLs  LOWER EXTREMITY MMT:   MMT Right eval Left eval Left 02/09/23  Hip flexion 4/5 3+/5 4/5  Hip extension 4/5 3+/5   Hip abduction 5/5 4/5   Hip adduction     Hip internal  rotation     Hip external rotation     Knee flexion 5/5 5/5   Knee extension 5/5 5/5   Ankle dorsiflexion 4/5 3+/5 3+/5  Ankle plantarflexion 4/5 4/5   Ankle inversion     Ankle eversion      (Blank rows = not tested)  LUMBAR SPECIAL TESTS:  Slump test: Negative and Single leg stance test: >20 seconds bilaterally 01/26/23: + figure 4 test   [facility] Adult PT Treatment:                                                DATE: 02/15/23 Therapeutic Exercise: *** Manual Therapy: *** Neuromuscular re-ed: *** Therapeutic Activity: *** Gait: *** Modalities: *** Self Care: ***   [facility] Adult PT Treatment:                                                DATE: 02/09/2023 Therapeutic Exercise: -6 degrees from extension knee flexion Standing Hip abduction red band x 10 reps Hip extension red band x 10 reps Hip flexion red band x 10 reps Squats reviewed Seated Ankle dorsiflexion x blue band x 10 reps MMT Prone press up with one leg on mat and one off x 3 reps x 30 second holds  [facility] Adult PT Treatment:                                                DATE: 01/31/23 Therapeutic Exercise: Standing  Squat to overhead press 10# x 10 reps Squat x 12 reps wit hcues on hip hinging Split squat x 10 reps R and L sides 10# Split squat rotation R and L x 12 reps Single leg dead lift x 10 reps working on single leg stance in between Seated Piriformis stretch Pigeon stretch with yoga block Supine Figure 4 stretch with  lateral hip discomfort Bridging with coregous ball Bridging with red band with hip abduction Piriformis stretch L side-- some discomfort Manual Therapy: Overpressure into hip ER and IR with isometrics for contract/relax STM lateral border of sacrum  [facility] Adult PT Treatment:                                                DATE: 01/26/2023 Therapeutic Exercise: Supine Figure 4 stretch Bridges adding green theraband abduction to reduce lateral pain Hip flexion isometrics x 5 seconds  x 5 reps Prone Hip IR/ER with passive overpressure Contract/relax and isometrics hip IR/ER Stand Hip hinge with dowel with tactile cues x 10 reps Squats with kettle bell 10# x 10 reps Squats with 10# weights (5 in each hand) x 10 reps Manual Therapy: STM L gluts with passive IR/ER hip in prone and deep pressure in gluts Self Care: Tennis ball rollout for STM against wall     PATIENT EDUCATION:  Education details: HEP, plan of care, discussed dry needling and traction as potential interventions if needed Person educated: Patient Education method: Explanation, Demonstration, and Handouts Education comprehension: verbalized understanding, returned demonstration, and needs further education  HOME EXERCISE PROGRAM: Access Code: [email or web address] URL: [email or web address] Date: 02/09/2023 Prepared by: Margretta Ditty  Exercises - Cat Cow  - 1 x daily - 3 x weekly - 1 sets - 10 reps - Prone Press Up  - 1 x daily - 3 x weekly - 1 sets - 10 reps - Seated Hamstring Stretch with Chair  - 1 x daily - 3 x weekly - 1 sets - 3 reps - 30 seconds hold - Seated Hip External Rotation Stretch  - 1 x daily - 3 x weekly - 1 sets - 10 reps - Dumbbell Squat at Shoulders  - 1 x daily - 3 x weekly - 1 sets - 10 reps - Single-Leg [place] Deadlift With Dumbbell  - 1 x daily - 3 x weekly - 2 sets - 5 reps - Seated Hip External Rotation with Resistance  - 1 x daily - 3 x weekly - 1 sets - 10 reps - Seated Hip Internal Rotation with Anchored Resistance  - 1 x daily - 3 x weekly - 1 sets - 10 reps - Hip Abduction with Resistance Loop  - 1 x daily - 3 x weekly - 1 sets - 10 reps - Hip Extension with Resistance Loop  - 1 x daily - 3 x weekly - 1 sets - 10 reps - Standing Hip Flexion with Resistance Loop  - 1 x daily - 3 x weekly - 1 sets - 10 reps - Seated Ankle Dorsiflexion with Resistance  - 1 x daily - 3 x weekly - 1 sets - 10 reps - Prone SIJ Anterior Rotation Mobilization on Table  - 1 x daily -  3 x weekly - 1 sets - 2 reps - 30 seconds hold - Glute Max Release With Campbell Soup Against Wall  - 1 x daily - 3 x weekly - 1 sets - 10 reps  ASSESSMENT:  CLINICAL IMPRESSION: *** The patient is continuing to make progress now meeting 3/3 STGs. She does continue with weakness in L hip flexors and L anterior tibialis. PT focused interventions at hip strength and ankle strength and divided HEP into 2 groups to alternate days. Pain is improving and patient  is tolerating movement well. We will continue to progress strengthening.   EVAL: Patient is a 53 y.o. female who was seen today for physical therapy evaluation and treatment for L low back pain with sciatica. She presents with pain that is constant in nature, that increases with heavy lifting, and being in a prolonged position (sitting or standing). She has LE weakness greater on the L side as compared to the R side, hypomobility lumbar spine, shortened L hamstring musculature.   GOALS: Goals reviewed with patient? Yes  SHORT TERM GOALS: Target date:02/19/23  The patient will be indep with HEP. Baseline: initiated at eval. Goal status:  MET   2.  The patient will report pain in the evening < or equal to 3/10. Baseline:  7/10 in evenings Goal status: MET. Patient is 0/10 currently and experiences 'discomfort" at times in left anterior hip.   3.  The patient will improve L HS length to -10 degrees from full extension (supine 90/90 position). Baseline:  -20 Goal status: MET -6 degrees   LONG TERM GOALS: Target date: 03/20/23  The patient will be indep with HEP progression. Baseline:  initiated at eval. Goal status: INITIAL  2.  The patient will maintain FOTO score of 94%. Baseline:  not limited in activities, but does have pain with chores. Goal status: INITIAL  3.  The patient will improve hip flexion strength to 5/5 bilaterally. Baseline:  see MMT chart Goal status: INITIAL  4.  The patient will improve bilateral ankle DF to  5/5. Baseline: see MMT chart Goal status: INITIAL  5.  The patient will report resolution of pain in L thigh/gluteal and sacral region. Baseline:  varies Goal status: INITIAL  PLAN:  PT FREQUENCY: 2x/week  PT DURATION: 8 weeks  PLANNED INTERVENTIONS: 97164- PT Re-evaluation, 97110-Therapeutic exercises, 97530- Therapeutic activity, 97112- Neuromuscular re-education, 97535- Self Care, 16109- Manual therapy, 97014- Electrical stimulation (unattended), [id number]- Traction (mechanical), [id number]- Ionotophoresis 4mg /ml Dexamethasone, Patient/Family education, Taping, Dry Needling, Joint mobilization, Cryotherapy, and Moist heat.  PLAN FOR NEXT SESSION: L ankle, hip strengthening, Plank progression, core engagement, squat activities for functional strengthening.   Jo [name], PT 02/15/2023, 7:53 AM

## 2023-02-21 ENCOUNTER — Ambulatory Visit: Payer: 59 | Admitting: Rehabilitative and Restorative Service Providers"

## 2023-07-14 DIAGNOSIS — Z01419 Encounter for gynecological examination (general) (routine) without abnormal findings: Secondary | ICD-10-CM | POA: Diagnosis not present

## 2023-07-14 DIAGNOSIS — Z124 Encounter for screening for malignant neoplasm of cervix: Secondary | ICD-10-CM | POA: Diagnosis not present

## 2023-07-14 DIAGNOSIS — Z01411 Encounter for gynecological examination (general) (routine) with abnormal findings: Secondary | ICD-10-CM | POA: Diagnosis not present

## 2023-07-14 DIAGNOSIS — Z113 Encounter for screening for infections with a predominantly sexual mode of transmission: Secondary | ICD-10-CM | POA: Diagnosis not present

## 2023-07-14 DIAGNOSIS — Z1331 Encounter for screening for depression: Secondary | ICD-10-CM | POA: Diagnosis not present

## 2023-07-14 DIAGNOSIS — Z0142 Encounter for cervical smear to confirm findings of recent normal smear following initial abnormal smear: Secondary | ICD-10-CM | POA: Diagnosis not present

## 2023-07-14 DIAGNOSIS — Z1231 Encounter for screening mammogram for malignant neoplasm of breast: Secondary | ICD-10-CM | POA: Diagnosis not present

## 2023-11-07 ENCOUNTER — Encounter: Payer: Self-pay | Admitting: Radiology
